# Patient Record
Sex: Female | Born: 1945 | Race: White | Hispanic: No | Marital: Married | Smoking: Never smoker
Health system: Southern US, Community
[De-identification: ages and names within clinical notes are randomized; demographics above are authoritative.]

## PROBLEM LIST (undated history)

## (undated) DIAGNOSIS — K5790 Diverticulosis of intestine, part unspecified, without perforation or abscess without bleeding: Secondary | ICD-10-CM

## (undated) DIAGNOSIS — I959 Hypotension, unspecified: Secondary | ICD-10-CM

## (undated) DIAGNOSIS — I10 Essential (primary) hypertension: Secondary | ICD-10-CM

## (undated) DIAGNOSIS — D1800 Hemangioma unspecified site: Secondary | ICD-10-CM

## (undated) DIAGNOSIS — K589 Irritable bowel syndrome without diarrhea: Secondary | ICD-10-CM

## (undated) DIAGNOSIS — K635 Polyp of colon: Secondary | ICD-10-CM

## (undated) DIAGNOSIS — G40909 Epilepsy, unspecified, not intractable, without status epilepticus: Secondary | ICD-10-CM

## (undated) DIAGNOSIS — I4891 Unspecified atrial fibrillation: Secondary | ICD-10-CM

## (undated) DIAGNOSIS — K5792 Diverticulitis of intestine, part unspecified, without perforation or abscess without bleeding: Secondary | ICD-10-CM

## (undated) HISTORY — DX: Hemangioma unspecified site: D18.00

## (undated) HISTORY — DX: Diverticulitis of intestine, part unspecified, without perforation or abscess without bleeding: K57.92

## (undated) HISTORY — DX: Essential (primary) hypertension: I10

## (undated) HISTORY — PX: SALIVARY GLAND SURGERY: SHX768

## (undated) HISTORY — DX: Unspecified atrial fibrillation: I48.91

## (undated) HISTORY — DX: Irritable bowel syndrome, unspecified: K58.9

## (undated) HISTORY — DX: Diverticulosis of intestine, part unspecified, without perforation or abscess without bleeding: K57.90

## (undated) HISTORY — PX: ABDOMINAL HYSTERECTOMY: SHX81

## (undated) HISTORY — PX: CERVICAL DISC SURGERY: SHX588

## (undated) HISTORY — DX: Epilepsy, unspecified, not intractable, without status epilepticus: G40.909

## (undated) HISTORY — DX: Polyp of colon: K63.5

## (undated) HISTORY — PX: OTHER SURGICAL HISTORY: SHX169

## (undated) HISTORY — PX: BREAST EXCISIONAL BIOPSY: SUR124

## (undated) HISTORY — PX: CHOLECYSTECTOMY: SHX55

## (undated) HISTORY — DX: Hypotension, unspecified: I95.9

---

## 1997-12-21 ENCOUNTER — Encounter: Admission: RE | Admit: 1997-12-21 | Discharge: 1998-03-21 | Payer: Self-pay

## 1998-10-12 ENCOUNTER — Other Ambulatory Visit: Admission: RE | Admit: 1998-10-12 | Discharge: 1998-10-12 | Payer: Self-pay | Admitting: Gastroenterology

## 1999-07-11 ENCOUNTER — Encounter: Admission: RE | Admit: 1999-07-11 | Discharge: 1999-07-11 | Payer: Self-pay | Admitting: Obstetrics and Gynecology

## 1999-07-11 ENCOUNTER — Encounter: Payer: Self-pay | Admitting: Obstetrics and Gynecology

## 1999-07-20 ENCOUNTER — Encounter: Admission: RE | Admit: 1999-07-20 | Discharge: 1999-07-20 | Payer: Self-pay | Admitting: Obstetrics and Gynecology

## 1999-07-20 ENCOUNTER — Encounter: Payer: Self-pay | Admitting: Obstetrics and Gynecology

## 2000-01-18 ENCOUNTER — Ambulatory Visit (HOSPITAL_COMMUNITY): Admission: RE | Admit: 2000-01-18 | Discharge: 2000-01-18 | Payer: Self-pay | Admitting: Gastroenterology

## 2000-01-18 ENCOUNTER — Encounter: Payer: Self-pay | Admitting: Gastroenterology

## 2000-07-15 ENCOUNTER — Encounter: Admission: RE | Admit: 2000-07-15 | Discharge: 2000-07-15 | Payer: Self-pay | Admitting: Obstetrics and Gynecology

## 2000-07-15 ENCOUNTER — Encounter: Payer: Self-pay | Admitting: Obstetrics and Gynecology

## 2001-08-12 ENCOUNTER — Encounter: Admission: RE | Admit: 2001-08-12 | Discharge: 2001-08-12 | Payer: Self-pay | Admitting: Family Medicine

## 2001-08-12 ENCOUNTER — Encounter: Payer: Self-pay | Admitting: Family Medicine

## 2001-09-09 ENCOUNTER — Encounter: Payer: Self-pay | Admitting: Family Medicine

## 2001-09-09 ENCOUNTER — Encounter: Admission: RE | Admit: 2001-09-09 | Discharge: 2001-09-09 | Payer: Self-pay | Admitting: Family Medicine

## 2001-09-24 ENCOUNTER — Ambulatory Visit (HOSPITAL_COMMUNITY): Admission: RE | Admit: 2001-09-24 | Discharge: 2001-09-24 | Payer: Self-pay | Admitting: Neurosurgery

## 2001-09-24 ENCOUNTER — Encounter: Payer: Self-pay | Admitting: Neurosurgery

## 2001-09-29 ENCOUNTER — Ambulatory Visit (HOSPITAL_COMMUNITY): Admission: RE | Admit: 2001-09-29 | Discharge: 2001-09-29 | Payer: Self-pay | Admitting: Neurosurgery

## 2001-09-29 ENCOUNTER — Encounter: Payer: Self-pay | Admitting: Neurosurgery

## 2001-09-30 ENCOUNTER — Ambulatory Visit (HOSPITAL_COMMUNITY): Admission: RE | Admit: 2001-09-30 | Discharge: 2001-09-30 | Payer: Self-pay | Admitting: Neurosurgery

## 2001-09-30 ENCOUNTER — Encounter: Payer: Self-pay | Admitting: Neurosurgery

## 2001-10-16 ENCOUNTER — Inpatient Hospital Stay (HOSPITAL_COMMUNITY): Admission: EM | Admit: 2001-10-16 | Discharge: 2001-10-22 | Payer: Self-pay | Admitting: Emergency Medicine

## 2002-03-22 ENCOUNTER — Encounter: Payer: Self-pay | Admitting: Neurosurgery

## 2002-03-22 ENCOUNTER — Encounter: Admission: RE | Admit: 2002-03-22 | Discharge: 2002-03-22 | Payer: Self-pay | Admitting: Neurosurgery

## 2002-06-01 ENCOUNTER — Other Ambulatory Visit: Admission: RE | Admit: 2002-06-01 | Discharge: 2002-06-01 | Payer: Self-pay | Admitting: Gynecology

## 2002-08-17 ENCOUNTER — Encounter: Admission: RE | Admit: 2002-08-17 | Discharge: 2002-08-17 | Payer: Self-pay | Admitting: Family Medicine

## 2002-08-17 ENCOUNTER — Encounter: Payer: Self-pay | Admitting: Family Medicine

## 2002-12-09 ENCOUNTER — Ambulatory Visit (HOSPITAL_COMMUNITY): Admission: RE | Admit: 2002-12-09 | Discharge: 2002-12-09 | Payer: Self-pay | Admitting: Internal Medicine

## 2002-12-27 ENCOUNTER — Encounter: Admission: RE | Admit: 2002-12-27 | Discharge: 2002-12-27 | Payer: Self-pay | Admitting: Family Medicine

## 2002-12-27 ENCOUNTER — Encounter: Payer: Self-pay | Admitting: Family Medicine

## 2003-03-11 ENCOUNTER — Ambulatory Visit (HOSPITAL_COMMUNITY): Admission: RE | Admit: 2003-03-11 | Discharge: 2003-03-11 | Payer: Self-pay | Admitting: Neurosurgery

## 2003-03-11 ENCOUNTER — Encounter: Payer: Self-pay | Admitting: Neurosurgery

## 2003-04-26 ENCOUNTER — Encounter: Admission: RE | Admit: 2003-04-26 | Discharge: 2003-04-26 | Payer: Self-pay | Admitting: Neurosurgery

## 2003-04-26 ENCOUNTER — Encounter: Payer: Self-pay | Admitting: Radiology

## 2003-04-26 ENCOUNTER — Encounter: Payer: Self-pay | Admitting: Neurosurgery

## 2003-05-13 ENCOUNTER — Encounter: Payer: Self-pay | Admitting: Neurosurgery

## 2003-05-13 ENCOUNTER — Encounter: Admission: RE | Admit: 2003-05-13 | Discharge: 2003-05-13 | Payer: Self-pay | Admitting: Neurosurgery

## 2003-05-27 ENCOUNTER — Encounter: Payer: Self-pay | Admitting: Neurosurgery

## 2003-05-27 ENCOUNTER — Encounter: Admission: RE | Admit: 2003-05-27 | Discharge: 2003-05-27 | Payer: Self-pay | Admitting: Neurosurgery

## 2003-08-26 ENCOUNTER — Encounter: Admission: RE | Admit: 2003-08-26 | Discharge: 2003-08-26 | Payer: Self-pay | Admitting: Family Medicine

## 2003-09-19 ENCOUNTER — Ambulatory Visit (HOSPITAL_COMMUNITY): Admission: RE | Admit: 2003-09-19 | Discharge: 2003-09-19 | Payer: Self-pay | Admitting: Gastroenterology

## 2003-11-24 ENCOUNTER — Ambulatory Visit (HOSPITAL_COMMUNITY): Admission: RE | Admit: 2003-11-24 | Discharge: 2003-11-24 | Payer: Self-pay | Admitting: Neurosurgery

## 2004-01-08 ENCOUNTER — Emergency Department (HOSPITAL_COMMUNITY): Admission: EM | Admit: 2004-01-08 | Discharge: 2004-01-08 | Payer: Self-pay | Admitting: Emergency Medicine

## 2004-07-18 ENCOUNTER — Ambulatory Visit (HOSPITAL_COMMUNITY): Admission: RE | Admit: 2004-07-18 | Discharge: 2004-07-18 | Payer: Self-pay | Admitting: Urology

## 2004-08-16 ENCOUNTER — Encounter (INDEPENDENT_AMBULATORY_CARE_PROVIDER_SITE_OTHER): Payer: Self-pay | Admitting: Specialist

## 2004-08-16 ENCOUNTER — Ambulatory Visit (HOSPITAL_COMMUNITY): Admission: RE | Admit: 2004-08-16 | Discharge: 2004-08-17 | Payer: Self-pay | Admitting: Surgery

## 2004-10-16 ENCOUNTER — Encounter: Admission: RE | Admit: 2004-10-16 | Discharge: 2004-10-16 | Payer: Self-pay | Admitting: Family Medicine

## 2005-01-02 ENCOUNTER — Ambulatory Visit: Payer: Self-pay | Admitting: Gastroenterology

## 2005-03-20 ENCOUNTER — Ambulatory Visit: Payer: Self-pay | Admitting: Gastroenterology

## 2005-06-08 ENCOUNTER — Ambulatory Visit (HOSPITAL_COMMUNITY): Admission: RE | Admit: 2005-06-08 | Discharge: 2005-06-08 | Payer: Self-pay | Admitting: Neurosurgery

## 2005-10-25 ENCOUNTER — Encounter: Admission: RE | Admit: 2005-10-25 | Discharge: 2005-10-25 | Payer: Self-pay | Admitting: Family Medicine

## 2006-01-09 ENCOUNTER — Ambulatory Visit (HOSPITAL_COMMUNITY): Admission: RE | Admit: 2006-01-09 | Discharge: 2006-01-09 | Payer: Self-pay | Admitting: Neurosurgery

## 2006-02-13 ENCOUNTER — Ambulatory Visit (HOSPITAL_COMMUNITY): Admission: RE | Admit: 2006-02-13 | Discharge: 2006-02-13 | Payer: Self-pay | Admitting: Neurosurgery

## 2006-06-27 ENCOUNTER — Ambulatory Visit: Payer: Self-pay | Admitting: Gastroenterology

## 2006-07-02 ENCOUNTER — Ambulatory Visit: Payer: Self-pay | Admitting: Cardiovascular Disease

## 2006-10-28 ENCOUNTER — Encounter: Admission: RE | Admit: 2006-10-28 | Discharge: 2006-10-28 | Payer: Self-pay | Admitting: Family Medicine

## 2006-11-06 ENCOUNTER — Ambulatory Visit: Payer: Self-pay | Admitting: Gastroenterology

## 2006-11-27 ENCOUNTER — Ambulatory Visit: Payer: Self-pay | Admitting: Gastroenterology

## 2007-01-13 ENCOUNTER — Ambulatory Visit (HOSPITAL_COMMUNITY): Admission: RE | Admit: 2007-01-13 | Discharge: 2007-01-13 | Payer: Self-pay | Admitting: Neurosurgery

## 2007-04-14 ENCOUNTER — Emergency Department (HOSPITAL_COMMUNITY): Admission: EM | Admit: 2007-04-14 | Discharge: 2007-04-15 | Payer: Self-pay | Admitting: Emergency Medicine

## 2007-04-27 ENCOUNTER — Ambulatory Visit: Payer: Self-pay | Admitting: Internal Medicine

## 2007-05-06 ENCOUNTER — Ambulatory Visit: Payer: Self-pay

## 2007-06-10 ENCOUNTER — Ambulatory Visit: Payer: Self-pay | Admitting: Internal Medicine

## 2007-10-07 ENCOUNTER — Other Ambulatory Visit: Admission: RE | Admit: 2007-10-07 | Discharge: 2007-10-07 | Payer: Self-pay | Admitting: Family Medicine

## 2007-10-14 ENCOUNTER — Encounter: Admission: RE | Admit: 2007-10-14 | Discharge: 2007-10-14 | Payer: Self-pay | Admitting: Family Medicine

## 2007-10-21 ENCOUNTER — Ambulatory Visit: Payer: Self-pay | Admitting: Internal Medicine

## 2007-12-18 ENCOUNTER — Emergency Department (HOSPITAL_COMMUNITY): Admission: EM | Admit: 2007-12-18 | Discharge: 2007-12-19 | Payer: Self-pay | Admitting: Emergency Medicine

## 2008-01-26 ENCOUNTER — Encounter: Admission: RE | Admit: 2008-01-26 | Discharge: 2008-01-26 | Payer: Self-pay | Admitting: Neurosurgery

## 2008-02-15 ENCOUNTER — Ambulatory Visit: Payer: Self-pay | Admitting: Internal Medicine

## 2008-02-18 ENCOUNTER — Encounter: Admission: RE | Admit: 2008-02-18 | Discharge: 2008-02-18 | Payer: Self-pay | Admitting: Otolaryngology

## 2008-03-10 ENCOUNTER — Ambulatory Visit: Payer: Self-pay

## 2008-03-10 ENCOUNTER — Ambulatory Visit: Payer: Self-pay | Admitting: Cardiology

## 2008-04-01 ENCOUNTER — Ambulatory Visit: Payer: Self-pay | Admitting: Internal Medicine

## 2008-08-03 ENCOUNTER — Ambulatory Visit: Payer: Self-pay | Admitting: Internal Medicine

## 2008-11-01 ENCOUNTER — Encounter: Admission: RE | Admit: 2008-11-01 | Discharge: 2008-11-01 | Payer: Self-pay | Admitting: Family Medicine

## 2008-11-22 ENCOUNTER — Encounter: Admission: RE | Admit: 2008-11-22 | Discharge: 2008-11-22 | Payer: Self-pay | Admitting: Family Medicine

## 2009-02-13 DIAGNOSIS — I4891 Unspecified atrial fibrillation: Secondary | ICD-10-CM | POA: Insufficient documentation

## 2009-02-13 DIAGNOSIS — I10 Essential (primary) hypertension: Secondary | ICD-10-CM | POA: Insufficient documentation

## 2009-02-13 DIAGNOSIS — D1800 Hemangioma unspecified site: Secondary | ICD-10-CM | POA: Insufficient documentation

## 2009-02-15 ENCOUNTER — Ambulatory Visit: Payer: Self-pay | Admitting: Internal Medicine

## 2009-03-29 ENCOUNTER — Telehealth: Payer: Self-pay | Admitting: Internal Medicine

## 2009-10-03 ENCOUNTER — Encounter: Admission: RE | Admit: 2009-10-03 | Discharge: 2009-10-03 | Payer: Self-pay | Admitting: Neurosurgery

## 2009-11-09 ENCOUNTER — Encounter: Admission: RE | Admit: 2009-11-09 | Discharge: 2009-11-09 | Payer: Self-pay | Admitting: Family Medicine

## 2010-04-17 ENCOUNTER — Telehealth: Payer: Self-pay | Admitting: Internal Medicine

## 2010-05-17 ENCOUNTER — Ambulatory Visit: Payer: Self-pay | Admitting: Internal Medicine

## 2010-05-17 ENCOUNTER — Encounter: Payer: Self-pay | Admitting: Internal Medicine

## 2010-08-26 ENCOUNTER — Encounter: Payer: Self-pay | Admitting: Neurosurgery

## 2010-09-06 NOTE — Progress Notes (Signed)
Summary: refill  Phone Note Refill Request Message from:  Patient on April 17, 2010 8:33 AM  Refills Requested: Medication #1:  FLECAINIDE ACETATE 50 MG TABS Take one tablet by mouth every 12 hours  Medication #2:  TOPROL XL 25 MG XR24H-TAB Take 1 tablet by mouth once a day [facility] [phone]  Initial call taken by: Judie Grieve,  April 17, 2010 8:34 AM    Prescriptions: TOPROL XL 25 MG XR24H-TAB (METOPROLOL SUCCINATE) Take 1 tablet by mouth once a day  #30 x 12   Entered by:   Kem Parkinson   Authorized by:   Laren Boom, MD, [facility]   Signed by:   Kem Parkinson on 04/18/2010   Method used:   Electronically to        [facility] [address] [phone]* (retail)       [address], [place]  [address]       Ph: [phone]       Fax: [phone]   RxID:   [id number] FLECAINIDE ACETATE 50 MG TABS (FLECAINIDE ACETATE) Take one tablet by mouth every 12 hours  #60 x 12   Entered by:   Kem Parkinson   Authorized by:   Laren Boom, MD, [facility]   Signed by:   Kem Parkinson on 04/18/2010   Method used:   Electronically to        [facility] [address] [phone]* (retail)       [address] [place]  [address]       Ph: [phone]       Fax: [phone]   RxID:   [id number]

## 2010-09-06 NOTE — Progress Notes (Signed)
  Phone Note Refill Request Call back at [phone] Message from:  Pharmacy/Alex on March 29, 2009 10:02 AM  Refills Requested: Medication #1:  FLECAINIDE ACETATE 50 MG TABS Take one tablet by mouth every 12 hours   Supply Requested: 3 months   Notes: 90 days supply  Medication #2:  TOPROL XL 25 MG XR24H-TAB Take 1 tablet by mouth once a day   Supply Requested: 3 months   Notes: 90 days suppy [facility] [place]   Method Requested: Fax to Local Pharmacy Initial call taken by: Migdalia Dk,  March 29, 2009 10:03 AM    Prescriptions: TOPROL XL 25 MG XR24H-TAB (METOPROLOL SUCCINATE) Take 1 tablet by mouth once a day  #90.0 Each x 2   Entered by:   Flonnie Overman   Authorized by:   Laren Boom, MD, [facility]   Signed by:   Flonnie Overman on 03/31/2009   Method used:   Electronically to        [facility]  [phone]* (retail)       [address]       [place], [place]  [address]       Ph: [phone] or [phone]       Fax: [phone]   RxID:   [id number] FLECAINIDE ACETATE 50 MG TABS (FLECAINIDE ACETATE) Take one tablet by mouth every 12 hours  #180.0 Each x 2   Entered by:   Flonnie Overman   Authorized by:   Laren Boom, MD, [facility]   Signed by:   Flonnie Overman on 03/31/2009   Method used:   Electronically to        [facility]  [phone]* (retail)       [address]       [place], [place]  [address]       Ph: [phone] or [phone]       Fax: [phone]   RxID:   [id number]

## 2010-09-06 NOTE — Assessment & Plan Note (Signed)
Summary: f1y   Visit Type:  Follow-up   History of Present Illness: Gwendolyn Alvarado returns today for followup of atrial fibrillation.  She is a 65 yo woman with a h/o atrial fibrillation which has been controlled very nicely with a combination of low dose flecainide and metoprolol. Since I saw her 12 months ago, she has had little if any atrial fibrillation.  She has tolerated her flecainide and beta blockers with out side effects. She denies peripheral edema.  Current Medications (verified): 1)  Aspirin Ec 325 Mg Tbec (Aspirin) .... Take One Tablet By Mouth Daily 2)  Flecainide Acetate 50 Mg Tabs (Flecainide Acetate) .... Take One Tablet By Mouth Every 12 Hours 3)  Toprol Xl 25 Mg Xr24h-Tab (Metoprolol Succinate) .... Take 1 Tablet By Mouth Once A Day 4)  Vimpat 50 Mg Tabs (Lacosamide) .... 3 Tablets Two Times A Day  Allergies (verified): 1)  ! Penicillin 2)  ! Vicodin 3)  ! Tegretol 4)  ! 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  History of benign breast biopsy on the right in the past.  History of submandibular gland surgery. History of Bartholins gland cyst resection in the past.  Vital Signs:  Patient profile:   65 year old female Height:      66.5 inches Weight:      146 pounds BMI:     23.30 Pulse rate:   69 / minute BP sitting:   120 / 82  (left arm)  Vitals Entered By: Laurance Flatten CMA (May 17, 2010 4:15 PM)  Physical Exam  General:  Well developed, well nourished, in no acute distress. Head:  normocephalic and atraumatic Eyes:  PERRLA/EOM intact; conjunctiva and lids normal. Mouth:  Teeth, gums and palate normal. Oral mucosa normal. Neck:  Neck supple, no JVD. No masses, thyromegaly or abnormal cervical nodes. Lungs:  Clear bilaterally to auscultation  with no wheezes or rhonchi. Heart:  RRR with normal S1 and S2. Abdomen:  Bowel sounds positive; abdomen soft and non-tender without masses, organomegaly, or hernias noted. No hepatosplenomegaly. Msk:  Back normal, normal gait. Muscle strength and tone normal. Pulses:  pulses normal in all 4 extremities Extremities:  No clubbing or cyanosis. No peripheral edema. Neurologic:  Alert and oriented x 3.   EKG  Procedure date:  05/17/2010  Findings:      Normal sinus rhythm with rate of: 65.   Impression & Recommendations:  Problem # 1:  PAROXYSMAL ATRIAL FIBRILLATION (ICD-427.31) She is maintaining NSR on low dose flecainide. Continue meds as below. Her updated medication list for this problem includes:    Aspirin Ec 325 Mg Tbec (Aspirin) .Marland Kitchen... Take one tablet by mouth daily    Flecainide Acetate 50 Mg Tabs (Flecainide acetate) .Marland Kitchen... Take one tablet by mouth every 12 hours    Toprol Xl 25 Mg Xr24h-tab (Metoprolol succinate) .Marland Kitchen... Take 1 tablet by mouth once a day  Orders: EKG w/ Interpretation (93000)  Problem # 2:  HYPERTENSION (ICD-401.9) Her blood pressure is well controlled.  She will continue her current meds. Her updated medication list for this problem includes:    Aspirin Ec 325 Mg Tbec (Aspirin) .Marland Kitchen... Take one tablet by mouth daily    Toprol Xl 25 Mg Xr24h-tab (Metoprolol succinate) .Marland Kitchen... Take 1 tablet by mouth once a day  Patient Instructions: 1)  Your physician wants  you to follow-up in:  12 months with Dr Ladona Ridgel. You will receive a reminder letter in the mail two months in advance. If you don't receive a letter, please call our office to schedule the follow-up appointment.

## 2010-09-06 NOTE — Assessment & Plan Note (Signed)
Summary: 6 month/dmp  Medications Added TOPROL XL 25 MG XR24H-TAB (METOPROLOL SUCCINATE) Take 1 tablet by mouth once a day CENTRUM SILVER ULTRA WOMENS  TABS (MULTIPLE VITAMINS-MINERALS) 1 by mouth once daily      Allergies Added: ! PENICILLIN ! VICODIN ! TEGRETOL ! DILANTIN  CC:  none.  History of Present Illness: Ms. Gwendolyn Alvarado returns today for followup of atrial fibrillation.  She is a 65 yo woman with a h/o atrial fibrillation which has been controlled very nicely with a combination of low dose flecainide and metoprolol. Since I saw her 6 months ago, she has had little if any atrial fibrillation.  She has tolerated her flecainide and beta blockers with out side effects. She denies peripheral edema.  Current Medications (verified): 1)  Depakote Er 500 Mg Xr24h-Tab (Divalproex Sodium) .Marland Kitchen.. 1 By Mouth Once Daily 2)  Aspirin Ec 325 Mg Tbec (Aspirin) .... Take One Tablet By Mouth Daily 3)  Flecainide Acetate 50 Mg Tabs (Flecainide Acetate) .... Take One Tablet By Mouth Every 12 Hours 4)  Kapidex 60 Mg Cpdr (Dexlansoprazole) .Marland Kitchen.. 1 By Mouth Two Times A Day 5)  Florastor 250 Mg Pack (Saccharomyces Boulardii) .Marland Kitchen.. 1 By Mouth Once Daily 6)  Vitamin E 600 Unit  Caps (Vitamin E) .Marland Kitchen.. 1 By Mouth Once Daily 7)  Toprol Xl 25 Mg Xr24h-Tab (Metoprolol Succinate) .... Take 1 Tablet By Mouth Once A Day 8)  Centrum Silver Ultra Womens  Tabs (Multiple Vitamins-Minerals) .Marland Kitchen.. 1 By Mouth Once Daily  Allergies (verified): 1)  ! Penicillin 2)  ! Vicodin 3)  ! Tegretol 4)  ! 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  History of benign breast biopsy on the right in the past.  History of submandibular gland surgery. History of Bartholins gland cyst resection in the past.  Review of Systems  The  patient denies chest pain, syncope, dyspnea on exertion, and peripheral edema.    Physical Exam  General:  Well developed, well nourished, in no acute distress. Head:  normocephalic and atraumatic Eyes:  PERRLA/EOM intact; conjunctiva and lids normal. Mouth:  Teeth, gums and palate normal. Oral mucosa normal. Neck:  Neck supple, no JVD. No masses, thyromegaly or abnormal cervical nodes. Lungs:  Clear bilaterally to auscultation and percussion. Heart:  Non-displaced PMI, chest non-tender; regular rate and rhythm, S1, S2 without murmurs, rubs or gallops. Carotid upstroke normal, no bruit. Normal abdominal aortic size, no bruits. Femorals normal pulses, no bruits. Pedals normal pulses. No edema, no varicosities. Abdomen:  Bowel sounds positive; abdomen soft and non-tender without masses, organomegaly, or hernias noted. No hepatosplenomegaly. Msk:  Back normal, normal gait. Muscle strength and tone normal. Pulses:  pulses normal in all 4 extremities Extremities:  No clubbing or cyanosis. No peripheral edema. Neurologic:  Alert and oriented x 3.   Vital Signs:  Patient profile:   65 year old female Height:      66.5 inches Weight:      147 pounds BMI:     23.46 Pulse rate:   69 / minute Pulse rhythm:   regular BP sitting:   112 / 70  (left arm) Cuff size:   regular  Vitals Entered By: Flonnie Overman (February 15, 2009 9:44 AM)  EKG  Procedure date:  02/15/2009  Findings:      Normal sinus rhythm with rate of:  65. Non-specific  T wave abnormality.  Impression & Recommendations:  Problem # 1:  PAROXYSMAL ATRIAL FIBRILLATION (ICD-427.31) She has been basically asymptomatic on low dose flecainide and beta blockers.  Will continue these meds and followup in 12 months.  She is not a coumadin candidate secondary to her prior hemangioma. Her updated medication list for this problem includes:    Aspirin Ec 325 Mg Tbec (Aspirin) .Marland Kitchen... Take one tablet by mouth daily    Flecainide Acetate 50 Mg  Tabs (Flecainide acetate) .Marland Kitchen... Take one tablet by mouth every 12 hours    Toprol Xl 25 Mg Xr24h-tab (Metoprolol succinate) .Marland Kitchen... Take 1 tablet by mouth once a day  Orders: EKG w/ Interpretation (93[address])  Problem # 2:  HYPERTENSION (ICD-401.9) Her blood pressure remains well controlled.  Continue toprol. Her updated medication list for this problem includes:    Aspirin Ec 325 Mg Tbec (Aspirin) .Marland Kitchen... Take one tablet by mouth daily    Toprol Xl 25 Mg Xr24h-tab (Metoprolol succinate) .Marland Kitchen... Take 1 tablet by mouth once a day  Patient Instructions: 1)  Your physician recommends that you schedule a follow-up appointment in: 12 months Prescriptions: TOPROL XL 25 MG XR24H-TAB (METOPROLOL SUCCINATE) Take 1 tablet by mouth once a day  #30 x 11   Entered by:   Duncan Dull, RN, BSN   Authorized by:   Laren Boom, MD, [facility]   Signed by:   Duncan Dull, RN, BSN on 02/15/2009   Method used:   Electronically to        [facility]  [phone]* (retail)       [address]       [place], [place]  [address]       Ph: [phone] or [phone]       Fax: [phone]   RxID:   [id number]

## 2010-09-18 ENCOUNTER — Other Ambulatory Visit: Payer: Self-pay | Admitting: Family Medicine

## 2010-09-18 DIAGNOSIS — N6321 Unspecified lump in the left breast, upper outer quadrant: Secondary | ICD-10-CM

## 2010-09-18 DIAGNOSIS — N644 Mastodynia: Secondary | ICD-10-CM

## 2010-09-24 ENCOUNTER — Other Ambulatory Visit: Payer: Self-pay

## 2010-09-25 ENCOUNTER — Ambulatory Visit
Admission: RE | Admit: 2010-09-25 | Discharge: 2010-09-25 | Disposition: A | Discharge status: Home / Self Care | Payer: BC Managed Care – PPO | Source: Ambulatory Visit | Attending: Family Medicine | Admitting: Family Medicine

## 2010-09-25 ENCOUNTER — Ambulatory Visit
Admission: RE | Admit: 2010-09-25 | Discharge: 2010-09-25 | Disposition: A | Discharge status: Home / Self Care | Payer: Medicare Other | Source: Ambulatory Visit | Attending: Family Medicine | Admitting: Family Medicine

## 2010-09-25 DIAGNOSIS — N6321 Unspecified lump in the left breast, upper outer quadrant: Secondary | ICD-10-CM

## 2010-09-25 DIAGNOSIS — N644 Mastodynia: Secondary | ICD-10-CM

## 2010-12-18 NOTE — Assessment & Plan Note (Signed)
[facility]                         ELECTROPHYSIOLOGY OFFICE NOTE   NAME:Alvarado Alvarado TOTARO                          MRN:          [id number]  DATE:04/01/2008                            DOB:          1945-11-03    Alvarado Alvarado returns today for followup.  She is very pleasant 65 year old  woman with a history of paroxysmal Afib.  She has history of  hypertension.  She has cavernous hemangioma.  The patient was initially  referred back to me in March of this year and at that time, we talked  about the different treatment options and recommended combination of  beta-blocker and low-dose flecainide to control her symptoms.  She has  done well with this.  If any palpitations, she denies chest pain or  shortness of breath.  She underwent stress testing, which demonstrated  no evidence of ischemia, and no pararrhythmia on her flecainide.   MEDICATIONS:  1. Depakote ER 500 twice a day.  2. Zegerid 40/100 twice daily.  3. Aspirin 325 a day.  4. Symax 0.375 p.r.n.  5. Metoprolol succinate 25 mg daily.  6. Flecainide 50 twice a day.   PHYSICAL EXAMINATION:  GENERAL:  She is pleasant well-appearing 62-year-  old woman in no distress.  VITAL SIGNS:  Blood pressure was 120/72, pulse 68 and regular,  respirations were 18, weight was 144 pounds.  NECK:  No jugular distention.  LUNGS:  Clear bilaterally to auscultation.  No wheezes, rales, or  rhonchi are present.  CARDIOVASCULAR:  Regular rate and rhythm.  Normal S1 and S2.  EXTREMITIES:  No edema.   EKG demonstrates sinus rhythm with normal axis and intervals.  QRS  duration was 110 milliseconds.   IMPRESSION:  1. Paroxysmal atrial fibrillation.  2. Borderline hypertension.  3. History of cavernous hemangioma.   DISCUSSION:  Alvarado Alvarado's AFib is quiet and stable.  She is tolerating  flecainide twice daily very nicely.  I will plan to see the patient back  in the office for followup of her AFib in 6 months.  She  is instructed to call the office for worsening AFib, in which case we  would consider increasing her flecainide to 50 three times a day or to  total of 150 mg in divided doses.     Doylene Canning. Ladona Ridgel, MD  Electronically Signed    GWT/MedQ  DD: 04/01/2008  DT: 04/02/2008  Job #: [id number]

## 2010-12-18 NOTE — Letter (Signed)
April 27, 2007    Payton Doughty, M.D.  [address]  [address]  [place], [place] [address]   RE:  BRIANAH, HOPSON  MRN:  [id number]  /  DOB:  1946-06-01   Dear Loraine Leriche,   Today I saw Carma Lair as a patient of yours, who has a history of  cavernous hemangioma.  She was in the emergency department several weeks  ago with atrial fibrillation and a rapid ventricular response.  The  patient subsequently reverted back to sinus rhythm and has been  discharged on aspirin therapy along with beta blockers and has had no  more symptoms of A fib.  Otherwise, she is stable.   Her exam was fairly unremarkable today, as was her EKG.   I have discussed the treatment options with the patient.  For now, there  is no indication for Coumadin therapy, but I think full-strength aspirin  would be warranted for thromboembolic prevention.  She states that she  has spoken to you, or her daughter, who is a physician in [place], has  spoken to you, and the aspirin therapy should be safe with her  neurologic condition.  I plan on discussing this with you further but  would like to go ahead and have her continue on aspirin.  We will obtain  a 2D echo.  Will continue her beta blocker.  Will plan to see her back  in the office in several months  She is instructed to call us if she  develops A fib.   Once again, thanks for referring Ms. Awtrey for EP evaluation.  I will  keep you apprised if any new developments, specifically whether she by  chance might ultimately need to be on Coumadin, should develop.    Sincerely,      Doylene Canning. Ladona Ridgel, MD  Electronically Signed    GWT/MedQ  DD: 04/27/2007  DT: 04/28/2007  Job #: [id number]

## 2010-12-18 NOTE — Assessment & Plan Note (Signed)
[facility]                         ELECTROPHYSIOLOGY OFFICE NOTE   NAME:Gwendolyn Alvarado, Gwendolyn Alvarado                          MRN:          [id number]  DATE:08/03/2008                            DOB:          Mar 19, 1946    Gwendolyn Alvarado returns today for followup.  She is a very pleasant middle-aged  woman with a history of symptomatic atrial fibrillation who I saw  initially several months ago.  She was placed at that time on a  combination of flecainide and beta-blockers and she returns today for  followup.  Since I saw her last several months ago, she had done quite  well.  She has no symptomatic palpitations.  She has had no documented  AFib.  She is stable.  She denies any problems with her blood pressure.  She notes that she is about to go on a cruise.  She wants to know  whether alcohol intake would be acceptable and whether she could have  chocolate to eat.   CURRENT MEDICATIONS:  1. Depakote ER 500 twice a day.  2. Aspirin 325 a day.  3. Symax p.r.n.  4. Flecainide 50 twice a day.  5. Kapidex 60 b.i.d.  6. Florastor 250 mg a day.  7. Vitamin E.   PHYSICAL EXAMINATION:  GENERAL:  She is a pleasant well-appearing middle-  aged woman, in no distress.  VITAL SIGNS:  Blood pressure was 110/70, the pulse was 80 and regular,  respirations were 18.  Weight was 144 pounds.  NECK:  No jugular venous distention.  There is no thyromegaly.  LUNGS:  Clear bilaterally to auscultation.  No wheezes, rales, or  rhonchi are present.  CARDIOVASCULAR:  Regular rate and rhythm.  Normal S1 and S2.  There was  occasional skip beat noted.  There were no murmurs, rubs, or gallops.  ABDOMEN:  Soft, nontender.  There is no organomegaly.  The bowel sounds  are present.  There is no rebound or guarding.  EXTREMITIES:  Demonstrate no cyanosis, clubbing, or edema.  The pulses are 2+ and  symmetric.  NEUROLOGIC:  Alert and oriented x3.  Cranial nerves intact.   EKG demonstrates sinus  rhythm with frequent PVCs.   IMPRESSION:  1. Symptomatic and paroxysmal atrial fibrillation.  2. Flecainide therapy secondary to symptomatic and paroxysmal atrial      fibrillation.  3. Borderline hypertension.  4. History of cavernous angioma, status post evaluation.   DISCUSSION:  Overall, Gwendolyn Alvarado is stable.  She is maintaining in sinus  rhythm very nicely on flecainide therapy.  I will plan to see the  patient back in the office for followup in 6 months.     Doylene Canning. Ladona Ridgel, MD  Electronically Signed    GWT/MedQ  DD: 08/03/2008  DT: 08/04/2008  Job #: [id number]

## 2010-12-18 NOTE — Assessment & Plan Note (Signed)
[facility]                         ELECTROPHYSIOLOGY OFFICE NOTE   NAME:Snuffer, MARCELLA CHARLSON                          MRN:          [id number]  DATE:06/10/2007                            DOB:          21-Aug-1945    Ms. Margulies returns today for follow-up.  She is a very pleasant middle-  aged woman with paroxysmal atrial fibrillation and a history of a  cavernous hemangioma, who returns today for follow-up.  When I saw her  back in the office in September I recommended that we undergo a 2-D  echo, which demonstrated preserved LV systolic function.  She also had a  mildly enlarged left atrium.  Otherwise, she was stable.  Since her  clinic visit in September she has done well with very minimal, if any,  palpitations or recurrent episodes of atrial fibrillation.   MEDICATIONS:  1. Depakote ER 500 mg twice daily.  2. Zegerid.  3. Aspirin 325 mg a day.  4. Lyrica 50 mg a day.  5. Metoprolol 25 mg a day.  6. Fosamax.  7. Caltrate.   On exam, she is a pleasant, well-appearing middle-aged woman in no acute  distress.  The blood pressure was 111/70, the pulse was 68 and regular,  respirations were 18.  The weight was 143 pounds.  NECK:  No jugular venous distention.  LUNGS:  Clear bilaterally to auscultation.  No wheezes, rales or rhonchi  were present.  CARDIOVASCULAR:  Regular rate and rhythm with normal S1 and S2.  EXTREMITIES:  No edema.   IMPRESSION:  1. Paroxysmal atrial fibrillation.  2. History of cavernous hemangioma.   DISCUSSION:  Overall, Ms. Gural is stable.  Her atrial fibrillation is  well-controlled.  I have asked that she come back to see [place] in a year,  sooner should she have worsening symptoms.  She will be maintained on  aspirin therapy.     Doylene Canning. Ladona Ridgel, MD  Electronically Signed    GWT/MedQ  DD: 06/10/2007  DT: 06/11/2007  Job #: [id number]   cc:   Payton Doughty, M.D.

## 2010-12-18 NOTE — Assessment & Plan Note (Signed)
[facility]                         ELECTROPHYSIOLOGY OFFICE NOTE   NAME:Byrom, LEIDA LUTON                          MRN:          [id number]  DATE:02/15/2008                            DOB:          [birth year]    Ms. Easterwood returns today for followup.  She is a very pleasant 65 year old  woman with a history of cavernous hemangioma and paroxysmal AFib who I  saw back in March 2009.  At that time, we discussed the treatment  options for AFib which was slightly increased in frequency and severity  and it continues to be this way.  At that time, we decided to continue  her on beta-blockers with both long-acting given daily and short-acting  given p.r.n.  She is also on aspirin therapy in conjunction with her  Depakote for seizure control and Zegerid.  She returns today for  followup.  She notes that in the interim, her AFib has increased in  frequency lasting up to a couple of hours at a time.  She typically has  an episode every 2-3 weeks.  She notes that the worry of having this is  making her quality of life go down.   PHYSICAL EXAMINATION:  GENERAL:  Pleasant middle-aged woman who is in no  acute distress.  VITAL SIGNS:  Blood pressure was 130/78, the pulse was 76 and regular,  respirations were 18, and the weight was 144 pounds.  NECK:  No jugular venous distention.  LUNGS:  Clear bilaterally to auscultation.  No wheezes, rales, or  rhonchi are present.  CARDIOVASCULAR:  Regular rate and rhythm with normal S1 and S2.  There  are no murmurs, rubs, or gallops appreciated.  ABDOMEN:  Soft and nontender.  EXTREMITIES:  No cyanosis, clubbing, or edema.  The pulses were 2+ and  symmetric.   The EKG demonstrates sinus rhythm with normal axis and intervals.  I  noted an EKG from Dec 18, 2007, demonstrating paroxysms of AFib.   IMPRESSION:  1. Paroxysmal atrial fibrillation.  2. History of cavernous hemangioma.   DISCUSSION:  Ms. Hanken is stable.  Her AFib  appears to be increasing in  frequency and severity though not significantly so.  Because of the  anxiousness associated with AFib, the patient would like to try to have  better control on this affect.  I have asked that she start 50 mg twice  daily of flecainide in conjunction with her low-dose beta-blocker.  We  will plan on having her come back in for a stress test approximately a  week or two after starting her flecainide.  I will see her back after  this as to see how she does.     Doylene Canning. Ladona Ridgel, MD  Electronically Signed    GWT/MedQ  DD: 02/15/2008  DT: 02/16/2008  Job #: [id number]

## 2010-12-18 NOTE — Assessment & Plan Note (Signed)
[facility]                         ELECTROPHYSIOLOGY OFFICE NOTE   NAME:Gwendolyn Alvarado, Gwendolyn Alvarado                          MRN:          [id number]  DATE:03/10/2008                            DOB:          August 30, 1945    ALLERGIES:  She has allergies to PENICILLIN, CODEINE, DILANTIN, and  TEGRETOL.   NEUROSURGEON:  Payton Doughty, M.D.   PRESENTING CIRCUMSTANCES:  The patient is presenting for a exercise  treadmill after 2-week course of flecainide, which began on February 23, 2008.   BRIEF HISTORY:  This is a 65 year old female who has a history of  paroxysmal atrial fibrillation.  She has episodes every 2-3 weeks.  They  seem to be increasing in frequency and severity and she saw Dr. Lewayne Bunting on February 15, 2008.  Prior to her office visit in July, the patient  was on metoprolol succinate 25 mg daily, and she would carry  metoprolol  tartrate tablets, 25 mg,  to be used at the onset of a paroxysmal atrial  fibrillation.  At the office visit, February 15, 2008, the patient was  started on flecainide 50 mg b.i.d., which the patient started on February 23, 2008.  She presents for exercise treadmill study today March 10, 2008.   The patient's resting electrocardiogram showed a sinus rhythm with  narrow QRS with no ectopy.  The patient was started on a Bruce protocol  treadmill walk.  Her target heart rate was 134 beats per minute.  She  walked for 6 minutes 3 seconds before stopping, because her legs got  tired.  She was only mildly short of breath and had no chest pain.  The  patient had no ectopy until her heart rate got above 100 and then she  would have isolated PVCs, 1 every 7-12 beats in the range of 100-120  beats per minute.  Interestingly enough, when her heart rate is 120-30  there was absolutely no ectopy.  This course repeated itself as the  patient went into the recovery mode in the 100 to 120 range.  Once  again, the patient had isolated PVCs.  These were  asymptomatic.  The  patient did have some questioning as to what those were and was slightly  anxious about this, but the electrophysiology tech reassured her that  these were totally benign.  The patient will continue flecainide 50 mg  b.i.d. and she will see Dr. Ladona Ridgel in the office in 2-3 weeks.  This  appointment will be made before she leaves.   MEDICATIONS:  1. Depakote 500 mg twice daily.  2. Zegerid 40/1100 twice daily.  3. Enteric-coated aspirin 325 mg daily.  4. Metoprolol succinate 25 mg daily.  5. Metoprolol tartrate 25 mg p.r.n. for paroxysmal atrial fibrillation      and now flecainide 50 mg twice daily.   PAST MEDICAL HISTORY:  History of cavernous hemangioma.  She is status  post cholecystectomy.  She has irritable bowel syndrome and GERD.   Of note, she had an echocardiogram, July 2008, her ejection fraction was  55-65%  with mild diastolic dysfunction, mild mitral valve prolapse.  No  left ventricular wall motion abnormalities.  I  believe that she also has a history of seizure disorder.  Once again,  the patient follows up with Dr. Ladona Ridgel in the office.      Maple Mirza, PA  Electronically Signed      Doylene Canning. Ladona Ridgel, MD  Electronically Signed   GM/MedQ  DD: 03/10/2008  DT: 03/10/2008  Job #: [id number]

## 2010-12-18 NOTE — Assessment & Plan Note (Signed)
[facility]                         ELECTROPHYSIOLOGY OFFICE NOTE   NAME:Alvarado, Gwendolyn VENTRESS                          MRN:          [id number]  DATE:10/21/2007                            DOB:          1945/11/12    Gwendolyn Alvarado returns today for followup.  She is a very pleasant, 65-year-  old woman with a history of cavernous hemangioma and paroxysmal atrial  fibrillation who has been maintained on beta-blockers and aspirin.  She  returns today for followup.  Her Gwendolyn Alvarado, who is a physician, is with  her today and she noted that she had some increase in the frequency,  although she does not feel these very much.  She noticed one episode  where she was doing some talking in front of an audience where she felt  palpitations, weak, fatigued and lightheaded, but this resolved.  She  had no specific complaints otherwise.   PHYSICAL EXAMINATION:  GENERAL:  She is a pleasant, 65 year old woman in  no acute distress.  VITAL SIGNS:  Blood pressure was 108/68, pulse 64 and regular,  respirations 18, weight was 144 pounds.  NECK:  No jugular venous distention.  LUNGS:  Clear bilaterally to auscultation.  No wheezes, rales or rhonchi  are present.  No increased work of breathing.  CARDIAC:  Regular rate and rhythm.  Normal S1 and S2.  ABDOMEN:  Soft, nontender.  EXTREMITIES:  Demonstrated no cyanosis, clubbing or edema.  Pulses 2+  symmetric.   MEDICATIONS:  1. Depakote ER 500 twice daily.  2. Zegerid twice daily.  3. Aspirin 325 a day.  4. Lyrica 50 a day.  5. Metoprolol 25 daily.  This was long-acting.   EKG demonstrates sinus rhythm with normal axis and intervals.   IMPRESSION:  1. Paroxysmal atrial fibrillation.  2. Cavernous hemangioma.  3. Aspirin therapy secondary to #1.   DISCUSSION:  Overall, Gwendolyn Alvarado is stable.  I have spent a considerable  amount of time today talking with her Gwendolyn Alvarado and the patient regarding  treatment algorithms for atrial  fibrillation with specific regard to  rate versus rhythm control and no indication for Coumadin therapy.  At  the present time, she has a CHADS score of 0 and is not indicated for  Coumadin.  I suspect her hemangioma probably precludes her from Coumadin  at least ideally so.  With regard to her atrial fibrillation, I have  asked that she continue her once a day, long-acting beta-blocker and I  have given her a prescription to call in for short-acting p.r.n.  Lopressor.  I will see her back in the office in several months.     Gwendolyn Canning. Gwendolyn Ridgel, MD  Electronically Signed    GWT/MedQ  DD: 10/21/2007  DT: 10/22/2007  Job #: [id number]   cc:   Payton Doughty, M.D.

## 2010-12-18 NOTE — Assessment & Plan Note (Signed)
HEALTHCARE                         ELECTROPHYSIOLOGY OFFICE NOTE   NAME:Sleeper, SHAWNESE MAGNER                          MRN:          [id number]  DATE:04/27/2007                            DOB:          [birth year]    Ms. Glazebrook is referred today for evaluation of atrial fibrillation by Dr.  Trey Sailors.  The patient is a very pleasant 65 year old woman who  presented to the [facility] emergency department several weeks ago with  atrial fibrillation and a rapid ventricular response.  At that time, she  felt palpitations and a sense of uneasiness and maybe some very mild  dyspnea.  There was no chest pain and no syncope.  She ultimately  returned back to sinus rhythm spontaneously.  She was begun on  metoprolol 25 mg daily in the long-acting form and an aspirin.  There  was a question about whether or not this was safe, as the patient's past  medical history, by her report, is notable for a cavernous hemangioma.  She otherwise has been stable and really denies much in the way of  medical problems.  She has never had extensive atrial fibrillation in  the past, and her health has otherwise been stable.   Her past surgical history is notable for a gallbladder removal and  hysterectomy in the past.  She had a Bartholin's sebaceous gland cyst  removed in the past and a lumpectomy, all in the 70s.  The patient has  had very subtle neurologic findings, ultimately leading to the diagnosis  of her hemangioma.   Present medications include Depakote, Zegerid, Lyrica, aspirin 325 a  day, metoprolol 25 a day, and Fosamax.  She is also on calcium  supplements.   Her family history is notable for a mother who died of complications of  carcinoid syndrome.  Her father died in an accident.   SOCIAL HISTORY:  Patient denies tobacco or ethanol use.  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   REVIEW OF SYSTEMS:   Notable for occasional problems with constipation  and fatigue.  She also has a history of irritable bowel syndrome,  having seen Dr. Corinda Gubler in the past.  She has a history of reflux  symptoms.  She has a history of anxiety, a history of depression.  Otherwise her review of systems was negative.   PHYSICAL EXAMINATION:  She is a pleasant, well-appearing 65 year old  woman in no acute distress.  Blood pressure was 102/76, pulse 73 and regular.  Respirations were 16.  The weight was 145 pounds.  HEENT:  Normocephalic and atraumatic.  Pupils are equal and round.  Oropharynx is moist.  Sclerae are anicteric.  NECK:  No jugular venous distention.  There is no thyromegaly.  Trachea  is midline.  The carotids are 2+ and symmetric.  LUNGS:  Clear bilaterally to auscultation.  No wheezes, rales or rhonchi  are present.  There is no increased work of breathing.  CARDIOVASCULAR:  Regular rate and rhythm with a normal S1 and S2.  There  are no murmurs, rubs or gallops present.  The PMI was not enlarged nor  was it laterally displaced.  ABDOMEN:  Soft, nontender, nondistended.  There is no organomegaly.  The  bowel sounds are present.  There is no rebound or guarding.  EXTREMITIES:  No clubbing, cyanosis or edema.  Pulses are 2+ and  symmetric.  NEUROLOGIC:  Alert and oriented x3.  Cranial nerves are intact.  Strength is 5/5 and symmetric.   EKG demonstrates a sinus rhythm with normal axis and intervals.   IMPRESSION:  1. New onset atrial fibrillation, now back to sinus rhythm with      minimal symptoms.  2. A history of cavernous hemangioma.   DISCUSSION:  The patient's age of 89, lack of hypertension, diabetes,  heart failure or other risks for stroke make her a candidate for aspirin  therapy without Coumadin.  My sense is that her neurologic condition  would allow this and will plan on proceeding with the use of full-  strength aspirin, although I will confirm this with her  neurosurgeon,  Dr. Trey Sailors.  With regards to her A fib, I think we should obtain a  screening 2D echo.  There is no evidence of any ischemia, so I do not  think stress test at the present time is warranted.  We will plan on  seeing the patient back in the office in several months, sooner if her  echo shows LV dysfunction or other unexpected findings.     Doylene Canning. Ladona Ridgel, MD  Electronically Signed    GWT/MedQ  DD: 04/27/2007  DT: 04/28/2007  Job #: [id number]   cc:   Payton Doughty, M.D.

## 2010-12-21 NOTE — Consult Note (Signed)
[place]  Patient:    Gwendolyn Alvarado, Gwendolyn Alvarado Visit Number: [id number] MRN: [id number]          Service Type: MED Location: 3000 3023 01 Attending Physician:  Emeterio Reeve Dictated by:   Marlan Palau, M.D. Proc. Date: 10/20/01 Admit Date:  10/16/2001   CC:         [facility], [address] Gwendolyn Alvarado, M.D.   Consultation Report  HISTORY OF PRESENT ILLNESS:  The patient is a 65 year old left-handed white female born 1946-04-04 with a history of sensory events thought secondary to seizures.  The patient began having episodes in December 2002 with spells of transient sensory alteration in the face and hand on the right side without twitching, loss of consciousness, or clouding of consciousness.  The patient underwent a thorough evaluation that included an MRI of the brain, MRI spectroscopy, CT imaging, and angiogram that finally determined that the patient had a cavernous angioma involving the left temporal region.  The patient has been treated with Dilantin but got a rash to this and was switched to Tegretol and developed another rash that was quite severe with some oral lesions and rash throughout the body.  The patient was taken off of Tegretol, was admitted, and was placed on IV steroids.  Neurology was asked to see this patient for further evaluation.  The patient has not had any sensory events recently.  PAST MEDICAL HISTORY: 1. Left temporal venous angioma. 2. Status post hysterectomy in 1999. 3. History of benign breast biopsy on the right in the past. 4. History of submandibular gland surgery. 5. History of Bartholins gland cyst resection in the past.  CURRENT MEDICATIONS: 1. Pepcid 20 mg twice a day. 2. Docusate 100 mg q.12h. 3. Decadron 10 mg q.8h. 4. Xanax 0.5 mg if needed. 5. Benadryl if needed.  HABITS:  The patient does not smoke or drink.  ALLERGIES: 1. PENICILLIN. 2. TEGRETOL. 3.  DILANTIN.  SOCIAL HISTORY:  The patient [place] in the [place] area. She is married and has three children; all alive and well.  FAMILY HISTORY:  Notable for mother died with stroke and lung cancer.  Father died following a motor vehicle accident.  The patient has one sister who is alive and well.  No family history of seizures noted.  REVIEW OF SYSTEMS:  Notable for some occasional fevers recently with the rash. The patient denies headache, has occasional blurred vision.  Denies shortness of breath, chest pain, weakness in the extremities, bowel or bladder control problems, loss of consciousness, staggering problems, dizziness.  PHYSICAL EXAMINATION:  VITAL SIGNS:  Blood pressure is 105/68, heart rate is 92 and regular, respirations 20, temperature afebrile.  GENERAL:  This patient is a well-developed white female who is alert and cooperative at the time of examination.  HEENT:  Head is atraumatic.  Eyes:  Pupils are equal, round, and reactive to light.  Disks are flat bilaterally.  NECK:  Supple.  No carotid bruits noted.  RESPIRATORY:  Clear.  CARDIOVASCULAR:  Regular rate and rhythm with no obvious murmurs or rubs noted.  EXTREMITIES:  Without significant edema.  A rash is noted in the arms and legs, thighs, upper chest.  NEUROLOGIC:  The patient has symmetric face with good pinprick sensation on the face to pinprick and soft touch.  Visual fields are full.  Speech is well enunciated.  The patient has good strength on the arms and legs.  Good sensation to pinprick, soft touch, and vibratory sensation throughout.  The patient has good finger-nose-finger and heel-to-shin.  The patient is not ambulated.  Pronator drift is negative.  Deep tendon reflexes are symmetric and normal.  Toes are downgoing bilaterally.  LABORATORY DATA:  Laboratory values notable for a sodium of 139, potassium 3.5, chloride of 104, CO2 29, glucose 97, BUN 14, creatinine 0.7,  calcium 9.3, albumin of 4.2, total protein 7.0, AST of 32, ALT of 77, alkaline phosphatase of 108, total bilirubin 0.4.  White count 13.3, hematocrit 43.3, hemoglobin 15.1, MCV 90.7, platelets 227.  IMPRESSION: 1. History of venous cavernous angioma, left temporal region. 2. 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 3. Allergic response to DILANTIN and TEGRETOL.  This patient has a history that could be consistent with simple sensory seizures.  Given the nature of the seizure events, medical treatment of the events is not completely medically necessary.  One option would be not to treat with medication whatsoever.  If the episodes become very frequent or are changing in any way, particularly if it includes clouding of consciousness, treatment would be indicated.  First of all, we need to document whether these sensory events are actually seizures.  PLAN: 1. Electroencephalogram study as an outpatient. 2. Consider a video electroencephalogram monitoring if the    electroencephalogram studies are normal.  The patient is not going on    anticonvulsant therapy at this point. Dictated by:   Marlan Palau, M.D. Attending Physician:  Emeterio Reeve DD:  10/20/01 TD:  10/21/01 Job: [id number]

## 2010-12-21 NOTE — H&P (Signed)
[place]  Patient:    Gwendolyn Alvarado, Gwendolyn Alvarado Visit Number: [id number] MRN: [id number]          Service Type: MED Location: 3000 3023 01 Attending Physician:  Emeterio Reeve Dictated by:   Payton Doughty, M.D. Admit Date:  10/16/2001                           History and Physical  ADMITTING DIAGNOSIS:  Dilantin reaction.  SERVICE:  Neurosurgery.  HISTORY OF PRESENT ILLNESS:  [age]-year-old right-handed white lady with a left mesiotemporal cavernous hemangioma.  She was started on Dilantin a few weeks ago, developed a rash, stopped that, put her on Tegretol, she developed a rash again and has been having a little bit of blistering, some in her lip. She was placed on p.o. Decadron several days ago with an improvement.  As the Decadron was tapered, she worsened again and came to the emergency room.  PAST MEDICAL HISTORY:  Medical history is otherwise benign.  ALLERGIES:  Apparently to DILANTIN.  PAST SURGICAL HISTORY:  None.  MEDICATIONS:  She is on no other medications.  SOCIAL HISTORY:  She does not smoke or drink.  PHYSICAL EXAMINATION:  GENERAL:  She is awake.  SKIN:  She has a skin rash that is facial and on chest and arms, not on the palms of her hands.  There is some small blistering on the inside of her mouth.  HEENT:  Within normal limits.  NECK:  She has good range of motion of her neck.  CHEST:  Clear.  CARDIAC:  Regular rate and rhythm.  ABDOMEN:  Nontender with no hepatosplenomegaly.  BACK:  She has a little posterior chest wall pain with deep respirations. There is no palpable tenderness.  EXTREMITIES:  Without clubbing or cyanosis but she does have the rash on her legs.  NEUROLOGIC:  She is awake, alert and oriented.  Her cranial nerves are intact. Motor exam shows 5/5 strength throughout the upper and lower extremities.  No pronator drift.  Toes are downgoing.  Reflexes are nonpathologic.  CLINICAL IMPRESSION:   A Dilantin reaction.  PLAN:  She needs to be admitted and placed on high-dose steroids.  We will check liver function tests, CBC and a sed rate. Dictated by:   Payton Doughty, M.D. Attending Physician:  Emeterio Reeve DD:  10/16/01 TD:  10/17/01 Job: [id number]

## 2010-12-21 NOTE — Op Note (Signed)
NAME:  Gwendolyn Alvarado, Gwendolyn Alvarado                   ACCOUNT NO.:  [id number]   MEDICAL RECORD NO.:  [id number]          PATIENT TYPE:  OIB   LOCATION:  2550                         FACILITY:  [facility]   PHYSICIAN:  Velora Heckler, MD      DATE OF BIRTH:  02/03/1946   DATE OF PROCEDURE:  08/16/2004  DATE OF DISCHARGE:                                 OPERATIVE REPORT   PREOPERATIVE DIAGNOSIS:  Symptomatic cholelithiasis.   POSTOPERATIVE DIAGNOSIS:  Symptomatic cholelithiasis.   PROCEDURE:  Laparoscopic cholecystectomy with intraoperative  cholangiography.   SURGEON:  Velora Heckler, M.D.   ASSISTANT:  Leonie Man, M.D.   ANESTHESIA:  General.   ESTIMATED BLOOD LOSS:  Minimal.   PREPARATION:  Betadine.   COMPLICATIONS:  None.   INDICATIONS:  The patient is a 65 year old white female referred by Dr. Victorino Dike for symptomatic cholelithiasis.  The patient has had known  gallstones since 1999.  She has had intermittent symptoms.  Ultrasound in  February of 2005 at [facility] documented at 1.6 cm  gallstone.  The patient now comes to surgery for cholecystectomy.   BODY OF REPORT:  The procedure was done in OR #16 at the [facility].  The patient is brought to the operating room and placed in a  supine position on the operating room table.  Following administration of  general anesthesia, the patient was prepped and draped in the usual strict  aseptic fashion.  After ascertaining than an adequate level of anesthesia  had been obtained, an infraumbilical incision was made with a #15 blade.  Dissection was carried down through the subcutaneous tissues.  The fascia  was incised in the midline and the peritoneal cavity was entered cautiously.  A 0 Vicryl pursestring suture was placed in the fascia.  A Hasson cannula  was introduced and the abdomen insufflated with carbon dioxide.  The  laparoscope was introduced and the abdomen explored.  There were a few  adhesions to the lower midline incision.  The liver is slightly fibrotic but  without true cirrhosis.  The operative ports were placed along the right  costal margin, the midline, mid clavicular line, and anterior axillary line.  The fundus of the gallbladder was grasped and retracted cephalad.  Dissection was begun at the neck of the gallbladder.  The cystic duct is  dissected out along its length.  A clip is placed at the neck of the  gallbladder.  The cystic artery is dissected out, doubly clipped and  divided.  The cystic duct is then incised.  Clear yellow bile emanates from  the cystic duct.  Cook cholangiography catheter was introduced through a  stab wound in the right upper quadrant and inserted into the cystic duct.  It is secured with a Ligaclip.  Using C-arm fluoroscopy, real time  cholangiography is performed.  There is rapid flow of contrast into the  common bile duct.  It is normal caliber.  There is free flow distally into  the duodenum without filling defect or obstruction.  There is  reflux with  contrast into the right and left hepatic ductal systems.  The clip is  withdrawn and the [facility] catheter is removed from the peritoneal cavity.  The  cystic duct is triply clipped and divided.  The gallbladder is then  dissected out of the gallbladder bed using the hook electrocautery for  hemostasis.  There is either an accessory duct or small arterial branch high  in the gallbladder bed.  This is doubly clipped and divided.  The  gallbladder was then completely excised from the gallbladder bed using the  hook electrocautery for hemostasis.  The gallbladder is extracted through  the umbilical port without difficulty.  A 0 Vicryl pursestring suture is  tied securely.  The abdomen is irrigated with warm saline which is  evacuated.  The pneumoperitoneum is released.  The ports are removed.  There  is good hemostasis noted at all port sites.  The wounds are anesthetized  with local  anesthetic.  All wounds are closed with interrupted 4-0 Vicryl  subcuticular sutures.  The wounds are washed and dried and Benzoin and Steri-  Strips are applied.  Sterile dressings are applied.  The patient is awakened  from anesthesia and brought to the recovery room in stable condition.  The  patient tolerated the procedure well.      Todd   TMG/MEDQ  D:  08/16/2004  T:  08/16/2004  Job:  [id number]   cc:   Ulyess Mort, M.D. [facility]   Teena Irani. Arlyce Dice, M.D.  [address]  [place]  [place] [address]  Fax: [phone]

## 2010-12-21 NOTE — Discharge Summary (Signed)
[place]  Patient:    Gwendolyn Alvarado, Gwendolyn Alvarado Visit Number: [id number] MRN: [id number]          Service Type: MED Location: [id number] Attending Physician:  Emeterio Reeve Dictated by:   Payton Doughty, M.D. Admit Date:  10/16/2001 Discharge Date: 10/22/2001                             Discharge Summary  ADMISSION DIAGNOSIS:  Dilantin reaction with rash.  DISCHARGE DIAGNOSIS:  Dilantin reaction with rash.  OPERATIONS/PROCEDURES:  None.  SERVICE:  Neurosurgery.  HISTORY OF PRESENT ILLNESS:  This is a 65 year old white female whose history and physical is recounted in the chart.  She was started on Dilantin a couple of weeks ago, developed a rash and was switched to Tegretol. The rash continued.  She was on Decadron at home and had some improvement in the rash; however, tapered the Decadron and she has had an increase in the rash with blisters on her face and a bump on her head and she was admitted for IV Decadron.  PAST MEDICAL HISTORY:  Benign.  ALLERGIES:  DILANTIN.  PHYSICAL EXAMINATION:  General exam was remarkable for rash on her face, trunk and extremities; none on the soles of her feet and palms of her hand. She had one blister on the inside of her lower lip.  HOSPITAL COURSE:  She was admitted.  Sed rate was 3. White count was 13. She was afebrile.  Liver functions were within normal limits save for mild elevation in the ALT. Bilirubin was normal.  She was admitted, placed on Decadron.  She was on 10 mg IV q.6h., that was switched to p.o. q.8h.  She had a slight recurrence of her rash.  She was visited by dermatologist whose recommendation was for 10 mg p.o. q.6h. as well as __________ ointment.  This was continued without difficulty.  Marlan Palau, M.D., of neurology service, also good enough to see her. He said he would like her to be off all medications and then he would check an EEG in a couple of weeks. Currently she has  some diminution of her rash, although it is still present.  Itching is handled with Benadryl.  She is being discharged home on 10 mg p.o. q.6h.   We are going to leave her on that for a week and then begin tapering it. Dictated by:   Payton Doughty, M.D. Attending Physician:  Emeterio Reeve DD:  10/22/01 TD:  10/23/01 Job: [id number]

## 2010-12-21 NOTE — Assessment & Plan Note (Signed)
[facility]                         GASTROENTEROLOGY OFFICE NOTE   NAME:Gwendolyn Alvarado, Gwendolyn Alvarado                          MRN:          [id number]  DATE:06/27/2006                            DOB:          1946-03-24    Patient comes in, describes some mucus in her stool, five to six bowel  movements a day, starts out formed, then becomes watery, may not have a  bowel movement prior.  She feels like something should not be there,  especially in the right lower quadrant.  Feels it more when she is  sitting and driving.  She has not seen any blood or dark stools.   PHYSICAL EXAMINATION:  VITAL SIGNS:  Weight 146, blood pressure 118/70,  pulse 80 and regular.  HEENT:  Oropharynx was negative.  NECK:  Negative.  CHEST:  Clear.  HEART:  Revealed a regular rhythm without significant murmur.  ABDOMEN:  Soft, no masses or organomegaly.  Basically nontender.  EXTREMITIES:  Unremarkable.  RECTAL:  Deferred.   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  She also has had a  moderate degree of __________  in the past.   I talked to [facility] a long time about tortuous colon and why her bowel  movements started firm and then liquid and how the liquid is going from  the right colon on.  Hopefully, she has some insight into why these  things are happening and it is not necessarily a matter of obstructive  component.  I really tried to discourage her from having another  procedure so soon.  She is certainly not due yet and I really do not  think her symptoms warrant this as of yet.  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  One thing she takes now is some Depakote, AcipHex  and Colestid and Centrum Silver.  Because of the severe ongoing  gastritis she has, I really think she ought to stay on some Carafate.  I  wrote her a  prescription for this.  Because of her symptoms, I thought  we would relieve her anxiety by doing another CT of the abdomen and  pelvis and there was a mention about a small lesion, most likely benign,  over her right kidney, and I just thought followup would be important  because of the discomfort she was noting, but what she describes is  really right lower quadrant pain.  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     Ulyess Mort, MD  Electronically Signed    SML/MedQ  DD: 06/27/2006  DT: 06/27/2006  Job #: [id number]

## 2011-01-25 ENCOUNTER — Telehealth: Payer: Self-pay | Admitting: Internal Medicine

## 2011-01-25 NOTE — Telephone Encounter (Signed)
Pt has a-fib and wants to go to cardiac rehab with her husband, to be set up next week, needs referral from dr Ladona Ridgel

## 2011-01-25 NOTE — Telephone Encounter (Signed)
Spoke with patient and she states that they told her it will be $68 a month for her to go I explained to her that I did not think that this was the case  I called over and spoke with Byrd Hesselbach  The patient has a dx of afib  She as well as myself do not think that this will work  I have called the patient back and explained  She can call the rehab and ask more if she would like

## 2011-04-21 ENCOUNTER — Other Ambulatory Visit: Payer: Self-pay | Admitting: Internal Medicine

## 2011-05-01 LAB — VALPROIC ACID LEVEL: Valproic Acid Lvl: 92.7

## 2011-05-01 LAB — POCT I-STAT, CHEM 8
BUN: 21
Calcium, Ion: 1.13
Chloride: 105
Creatinine, Ser: 0.9
Glucose, Bld: 106 — ABNORMAL HIGH
HCT: 44
Hemoglobin: 15
Potassium: 4.5
Sodium: 140
TCO2: 29

## 2011-05-01 LAB — POCT CARDIAC MARKERS
CKMB, poc: <1 — ABNORMAL LOW
Myoglobin, poc: 38.6
Operator id: 270651
Troponin i, poc: <0.05

## 2011-05-07 ENCOUNTER — Ambulatory Visit: Payer: BC Managed Care – PPO | Admitting: Internal Medicine

## 2011-05-17 LAB — I-STAT 8, (EC8 V) (CONVERTED LAB)
Acid-Base Excess: 2
BUN: 12
Bicarbonate: 28.9 — ABNORMAL HIGH
Chloride: 105
Glucose, Bld: 95
HCT: 49 — ABNORMAL HIGH
Hemoglobin: 16.7 — ABNORMAL HIGH
Operator id: 272551
Potassium: 3.8
Sodium: 139
TCO2: 30
pCO2, Ven: 49.6
pH, Ven: 7.372 — ABNORMAL HIGH

## 2011-05-17 LAB — POCT CARDIAC MARKERS
CKMB, poc: <1 — ABNORMAL LOW
Myoglobin, poc: 50.6
Operator id: 196461
Troponin i, poc: <0.05

## 2011-05-17 LAB — VALPROIC ACID LEVEL: Valproic Acid Lvl: 75.9

## 2011-06-04 ENCOUNTER — Ambulatory Visit: Payer: BC Managed Care – PPO | Admitting: Internal Medicine

## 2011-07-02 ENCOUNTER — Encounter: Payer: Self-pay | Admitting: Internal Medicine

## 2011-07-02 ENCOUNTER — Ambulatory Visit (INDEPENDENT_AMBULATORY_CARE_PROVIDER_SITE_OTHER): Payer: Medicare Other | Admitting: Internal Medicine

## 2011-07-02 DIAGNOSIS — I4891 Unspecified atrial fibrillation: Secondary | ICD-10-CM

## 2011-07-02 DIAGNOSIS — I10 Essential (primary) hypertension: Secondary | ICD-10-CM

## 2011-07-02 MED ORDER — FLECAINIDE ACETATE 50 MG PO TABS: 50.0000 mg | ORAL_TABLET | Freq: Two times a day (BID) | ORAL | Status: DC

## 2011-07-02 MED ORDER — METOPROLOL SUCCINATE ER 25 MG PO TB24: 25.0000 mg | ORAL_TABLET | Freq: Every day | ORAL | Status: DC

## 2011-07-02 NOTE — Assessment & Plan Note (Signed)
The patient is maintaining sinus rhythm very nicely on her current antiarrhythmic medications. She will continue her medications.

## 2011-07-02 NOTE — Patient Instructions (Signed)
Your physician wants you to follow-up in: 12 months with Dr. Taylor. You will receive a reminder letter in the mail two months in advance. If you don't receive a letter, please call our office to schedule the follow-up appointment.    

## 2011-07-02 NOTE — Progress Notes (Signed)
HPI [name] returns today for followup. She is a very pleasant 65 year old woman with paroxysmal atrial for ablation. She has been maintained in sinus rhythm very nicely on flecainide. She denies palpitations, chest pain, or shortness of breath. No peripheral edema or syncope. Allergies  Allergen Reactions  . Carbamazepine   . Hydrocodone-Acetaminophen   . Penicillins   . Phenytoin      Current Outpatient Prescriptions  Medication Sig Dispense Refill  . aspirin 325 MG EC tablet Take 325 mg by mouth daily.        Marland Kitchen dexlansoprazole (DEXILANT) 60 MG capsule Take 60 mg by mouth 2 (two) times daily.        . flecainide (TAMBOCOR) 50 MG tablet Take 1 tablet (50 mg total) by mouth 2 (two) times daily.  180 tablet  3  . Lacosamide (VIMPAT) 150 MG TABS Take 1 tablet by mouth 2 (two) times daily.        Marland Kitchen DISCONTD: flecainide (TAMBOCOR) 50 MG tablet TAKE 1 TABLET BY MOUTH BY MOUTH EVERY 12 HOURS  60 tablet  11  . metoprolol succinate (TOPROL-XL) 25 MG 24 hr tablet Take 1 tablet (25 mg total) by mouth daily.  90 tablet  3  . DISCONTD: metoprolol succinate (TOPROL-XL) 25 MG 24 hr tablet TAKE 1 TABLET BY MOUTH EVERY DAY  30 tablet  11     Past Medical History  Diagnosis Date  . Angioma cavernosum   . HTN (hypertension)   . Atrial fibrillation   . Irritable bowel syndrome (IBS)   . Seizure disorder     ROS:   All systems reviewed and negative except as noted in the HPI.   Past Surgical History  Procedure Date  . Benign breast biopsy   . Salivary gland surgery   . Bartholins gland cyst resection      Family History  Problem Relation Age of Onset  . Stroke Mother   . Lung cancer Mother   . Seizures Neg Hx      History   Social History  . Marital Status: Married    Spouse Name: N/A    Number of Children: 3  . Years of Education: N/A   Occupational History  . Not on file.   Social History Main Topics  . Smoking status: Never Smoker   . Smokeless tobacco: Not on file    . Alcohol Use: No  . Drug Use: No  . Sexually Active: Not on file   Other Topics Concern  . Not on file   Social History Narrative  . No narrative on file     BP 122/70  Pulse 69  Ht 5' 6.5" (1.689 m)  Wt 66.225 kg (146 lb)  BMI 23.21 kg/m2  Physical Exam:  Well appearing middle-aged woman, NAD HEENT: Unremarkable Neck:  No JVD, no thyromegally Lungs:  Clear no wheezes, rales, or rhonchi. HEART:  Regular rate rhythm, no murmurs, no rubs, no clicks Abd:  soft, positive bowel sounds, no organomegally, no rebound, no guarding Ext:  2 plus pulses, no edema, no cyanosis, no clubbing Skin:  No rashes no nodules Neuro:  CN II through XII intact, motor grossly intact  ECG - NSR.  Assess/Plan:

## 2011-07-02 NOTE — Assessment & Plan Note (Signed)
Her blood pressure is very well controlled. I'm not completely certain that she even has hypertension. She will however continue her beta blocker to help control her ventricular rate when she goes into atrial fibrillation.

## 2011-07-12 ENCOUNTER — Ambulatory Visit: Payer: BC Managed Care – PPO | Admitting: Internal Medicine

## 2011-07-12 DIAGNOSIS — M26629 Arthralgia of temporomandibular joint, unspecified side: Secondary | ICD-10-CM | POA: Insufficient documentation

## 2011-07-12 DIAGNOSIS — K219 Gastro-esophageal reflux disease without esophagitis: Secondary | ICD-10-CM | POA: Insufficient documentation

## 2011-08-20 ENCOUNTER — Other Ambulatory Visit: Payer: Self-pay | Admitting: Family Medicine

## 2011-08-20 DIAGNOSIS — Z1231 Encounter for screening mammogram for malignant neoplasm of breast: Secondary | ICD-10-CM

## 2011-09-30 ENCOUNTER — Ambulatory Visit: Payer: Medicare Other

## 2011-10-07 ENCOUNTER — Ambulatory Visit: Payer: Medicare Other

## 2011-10-22 ENCOUNTER — Ambulatory Visit
Admission: RE | Admit: 2011-10-22 | Discharge: 2011-10-22 | Disposition: A | Discharge status: Home / Self Care | Payer: Medicare Other | Source: Ambulatory Visit | Attending: Family Medicine | Admitting: Family Medicine

## 2011-10-22 DIAGNOSIS — Z1231 Encounter for screening mammogram for malignant neoplasm of breast: Secondary | ICD-10-CM

## 2011-11-25 ENCOUNTER — Telehealth: Payer: Self-pay | Admitting: Internal Medicine

## 2011-11-25 NOTE — Telephone Encounter (Signed)
LMTCB

## 2011-11-25 NOTE — Telephone Encounter (Signed)
Metoprolol and flecinide written rx [place]

## 2011-11-27 ENCOUNTER — Other Ambulatory Visit: Payer: Self-pay | Admitting: *Deleted

## 2011-11-27 DIAGNOSIS — I4891 Unspecified atrial fibrillation: Secondary | ICD-10-CM

## 2011-11-27 DIAGNOSIS — I10 Essential (primary) hypertension: Secondary | ICD-10-CM

## 2011-11-27 MED ORDER — METOPROLOL SUCCINATE ER 25 MG PO TB24: 25.0000 mg | ORAL_TABLET | Freq: Every day | ORAL | Status: DC

## 2011-11-27 MED ORDER — FLECAINIDE ACETATE 50 MG PO TABS: 50.0000 mg | ORAL_TABLET | Freq: Two times a day (BID) | ORAL | Status: DC

## 2011-11-27 NOTE — Telephone Encounter (Signed)
Done and will have the PA sign today in Dr Lubertha Basque absence

## 2011-11-27 NOTE — Telephone Encounter (Signed)
Follow- up: ° ° °Patient returned your phone call. Please call back. °

## 2011-11-27 NOTE — Telephone Encounter (Signed)
This is Dr Lubertha Basque patient. I will forward to [place].

## 2011-11-28 ENCOUNTER — Telehealth: Payer: Self-pay | Admitting: Internal Medicine

## 2011-11-28 DIAGNOSIS — I4891 Unspecified atrial fibrillation: Secondary | ICD-10-CM

## 2011-11-28 DIAGNOSIS — I10 Essential (primary) hypertension: Secondary | ICD-10-CM

## 2011-11-28 MED ORDER — FLECAINIDE ACETATE 50 MG PO TABS: 50.0000 mg | ORAL_TABLET | Freq: Two times a day (BID) | ORAL | Status: DC

## 2011-11-28 MED ORDER — METOPROLOL SUCCINATE ER 25 MG PO TB24: 25.0000 mg | ORAL_TABLET | Freq: Every day | ORAL | Status: DC

## 2011-11-28 NOTE — Telephone Encounter (Signed)
New Problem:     The patient will be leaving for [place] early Sunday morning and would like to receive written prescriptions for her metoprolol succinate (TOPROL-XL) 25 MG 24 hr tablet and flecainide (TAMBOCOR) 50 MG tablet before the end of tomorrow so she can take them with her, just in case her medication is either lost or stolen. Please call back.

## 2011-11-28 NOTE — Telephone Encounter (Signed)
lmom for pt to call me back.  I had rx written and put to mail to pt yesterday but can send to drug store if needed.  I did not realize she was leaving so soon

## 2012-05-22 ENCOUNTER — Other Ambulatory Visit: Payer: Self-pay | Admitting: Internal Medicine

## 2012-06-10 ENCOUNTER — Emergency Department (HOSPITAL_COMMUNITY): Payer: Medicare Other

## 2012-06-10 ENCOUNTER — Emergency Department (HOSPITAL_COMMUNITY)
Admission: EM | Admit: 2012-06-10 | Discharge: 2012-06-10 | Disposition: A | Discharge status: Home / Self Care | Payer: Medicare Other | Attending: Emergency Medicine | Admitting: Emergency Medicine

## 2012-06-10 ENCOUNTER — Encounter (HOSPITAL_COMMUNITY): Payer: Self-pay | Admitting: Family Medicine

## 2012-06-10 DIAGNOSIS — Z8669 Personal history of other diseases of the nervous system and sense organs: Secondary | ICD-10-CM | POA: Insufficient documentation

## 2012-06-10 DIAGNOSIS — Z79899 Other long term (current) drug therapy: Secondary | ICD-10-CM | POA: Insufficient documentation

## 2012-06-10 DIAGNOSIS — W1809XA Striking against other object with subsequent fall, initial encounter: Secondary | ICD-10-CM | POA: Insufficient documentation

## 2012-06-10 DIAGNOSIS — W19XXXA Unspecified fall, initial encounter: Secondary | ICD-10-CM

## 2012-06-10 DIAGNOSIS — I10 Essential (primary) hypertension: Secondary | ICD-10-CM | POA: Insufficient documentation

## 2012-06-10 DIAGNOSIS — Y939 Activity, unspecified: Secondary | ICD-10-CM | POA: Insufficient documentation

## 2012-06-10 DIAGNOSIS — S0100XA Unspecified open wound of scalp, initial encounter: Secondary | ICD-10-CM | POA: Insufficient documentation

## 2012-06-10 DIAGNOSIS — R569 Unspecified convulsions: Secondary | ICD-10-CM | POA: Insufficient documentation

## 2012-06-10 DIAGNOSIS — Z7982 Long term (current) use of aspirin: Secondary | ICD-10-CM | POA: Insufficient documentation

## 2012-06-10 DIAGNOSIS — S0101XA Laceration without foreign body of scalp, initial encounter: Secondary | ICD-10-CM

## 2012-06-10 DIAGNOSIS — K589 Irritable bowel syndrome without diarrhea: Secondary | ICD-10-CM | POA: Insufficient documentation

## 2012-06-10 DIAGNOSIS — I4891 Unspecified atrial fibrillation: Secondary | ICD-10-CM | POA: Insufficient documentation

## 2012-06-10 DIAGNOSIS — Y929 Unspecified place or not applicable: Secondary | ICD-10-CM | POA: Insufficient documentation

## 2012-06-10 MED ORDER — ACETAMINOPHEN 325 MG PO TABS
650.0000 mg | ORAL_TABLET | Freq: Once | ORAL | Status: AC
  Administered 2012-06-10: 650 mg via ORAL
  Filled 2012-06-10: qty 2

## 2012-06-10 NOTE — ED Provider Notes (Signed)
History     CSN: [id number]  Arrival date & time 06/10/12  1716   First MD Initiated Contact with Patient 06/10/12 1734      Chief Complaint  Patient presents with  . Fall     HPI Pt fell PTA and hit head on a brick. Small lac to right side of head. Bleeding controlled. Pt hx of angioma cavernosum. Denies nausea, vomiting.denies vision loss or changes.   Past Medical History  Diagnosis Date  . Angioma cavernosum   . HTN (hypertension)   . Atrial fibrillation   . Irritable bowel syndrome (IBS)   . Seizure disorder     Past Surgical History  Procedure Date  . Benign breast biopsy   . Salivary gland surgery   . Bartholins gland cyst resection     Family History  Problem Relation Age of Onset  . Stroke Mother   . Lung cancer Mother   . Seizures Neg Hx     History  Substance Use Topics  . Smoking status: Never Smoker   . Smokeless tobacco: Not on file  . Alcohol Use: No    OB History    Grav Para Term Preterm Abortions TAB SAB Ect Mult Living                  Review of Systems All other systems reviewed and are negative Allergies  Carbamazepine; Hydrocodone-acetaminophen; Penicillins; and Phenytoin  Home Medications   Current Outpatient Rx  Name  Route  Sig  Dispense  Refill  . ASPIRIN 325 MG PO TBEC   Oral   Take 325 mg by mouth daily.           . DEXLANSOPRAZOLE 60 MG PO CPDR   Oral   Take 60 mg by mouth 2 (two) times daily.           Marland Kitchen FLECAINIDE ACETATE 50 MG PO TABS   Oral   Take 1 tablet (50 mg total) by mouth 2 (two) times daily.   60 tablet   3   . LACOSAMIDE 150 MG PO TABS   Oral   Take 1 tablet by mouth 2 (two) times daily.           Marland Kitchen METOPROLOL SUCCINATE ER 25 MG PO TB24   Oral   Take 1 tablet (25 mg total) by mouth daily.   30 tablet   3     BP 153/80  Pulse 76  Temp 98 F (36.7 C)  Resp 18  SpO2 100%  Physical Exam  Nursing note and vitals reviewed. Constitutional: She is oriented to person, place, and  time. She appears well-developed and well-nourished. No distress.  HENT:  Head: Normocephalic.         1 cm laceration with no active bleeding  Eyes: Pupils are equal, round, and reactive to light.  Neck: Normal range of motion.  Cardiovascular: Normal rate and intact distal pulses.   Pulmonary/Chest: No respiratory distress.  Abdominal: Normal appearance. She exhibits no distension.  Musculoskeletal: Normal range of motion.  Neurological: She is alert and oriented to person, place, and time. No cranial nerve deficit.  Skin: Skin is warm and dry. No rash noted.  Psychiatric: She has a normal mood and affect. Her behavior is normal.    ED Course  Procedures (including critical care time) LACERATION REPAIR Performed by: Nelia Shi Authorized by: Nelia Shi Consent: Verbal consent obtained. Risks and benefits: risks, benefits and alternatives were discussed Consent given by: patient  Patient identity confirmed: provided demographic data Prepped and Draped in normal sterile fashion Wound explored  Laceration Location:  right temple   Laceration Length:  1 cm  No Foreign Bodies seen or palpated  Anesthesia: local infiltration  Local anesthetic: lidocaine  to % with epinephrine  Anesthetic total:  3  ml  Irrigation method:  of cleaning: standard  Skin closure: Staples   Number of sutures:2  Technique: Interrupted   Patient tolerance: Patient tolerated the procedure well with no immediate complications. Labs Reviewed - No data to display Dg Cervical Spine Complete  06/10/2012  *RADIOLOGY REPORT*  Clinical Data: Fall, neck pain  CERVICAL SPINE - COMPLETE 4+ VIEW  Comparison: None  Findings: No prevertebral soft tissue swelling.  Normal alignment of the cervical vertebral bodies.  There is joint space narrowing C6-C7.  Normal spinal laminar line.  Open mouth odontoid view is partially obscured by the patient's teeth.  The lateral masses appear aligned.  IMPRESSION:  No radiographic evidence cervical spine fracture.   Original Report Authenticated By: Genevive Bi, M.D.    Ct Head Wo Contrast  06/10/2012  *RADIOLOGY REPORT*  Clinical Data: Larey Seat, striking the head on a brick.  Laceration to the right side of the head.  Known left temporal cavernous angioma.  CT HEAD WITHOUT CONTRAST  Technique:  Contiguous axial images were obtained from the base of the skull through the vertex without contrast.  Comparison: MRI brain 10/03/2009, 01/26/2008, 01/13/2007.  Findings: Ventricular system normal in size and appearance for age. Note made of small cysts in the choroid plexus of the lateral ventricles.  Approximate 1.6 x 1.2 cm high attenuation lesion in the medial left temporal lobe, consistent with the known cavernous angioma, unchanged.  No new mass lesion.  No midline shift.  No acute hemorrhage or hematoma.  No extra-axial fluid collections. No evidence of acute infarction.  Laceration to the right frontoparietal scalp approaching the vertex.  No underlying skull fracture. Visualized paranasal sinuses, bilateral mastoid air cells, and bilateral middle ear cavities well-aerated.  IMPRESSION:  1.  No acute intracranial abnormality. 2.  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 3.  Right frontoparietal scalp laceration without underlying skull fracture.   Original Report Authenticated By: Hulan Saas, M.D.      1. Fall   2. Scalp laceration       MDM          Nelia Shi, MD 06/10/12 2027

## 2012-06-10 NOTE — ED Notes (Signed)
Pt fell PTA and hit head on a brick. Small lac to right side of head. Bleeding controlled. Pt hx of angioma cavernosum. Denies nausea, vomiting.denies vision loss or changes.

## 2012-07-07 ENCOUNTER — Encounter: Payer: Self-pay | Admitting: Internal Medicine

## 2012-07-07 ENCOUNTER — Ambulatory Visit (INDEPENDENT_AMBULATORY_CARE_PROVIDER_SITE_OTHER): Payer: Medicare Other | Admitting: Internal Medicine

## 2012-07-07 VITALS — BP 106/64 | HR 72 | Ht 67.0 in | Wt 148.0 lb

## 2012-07-07 DIAGNOSIS — I4891 Unspecified atrial fibrillation: Secondary | ICD-10-CM

## 2012-07-07 DIAGNOSIS — I10 Essential (primary) hypertension: Secondary | ICD-10-CM

## 2012-07-07 MED ORDER — ASPIRIN EC 81 MG PO TBEC: 81.0000 mg | DELAYED_RELEASE_TABLET | Freq: Every day | ORAL | Status: DC

## 2012-07-07 NOTE — Assessment & Plan Note (Signed)
She is maintaining sinus rhythm very nicely on flecainide therapy. She will continue her current medications. We will reduce her dose of aspirin 81 mg daily.

## 2012-07-07 NOTE — Assessment & Plan Note (Signed)
Her blood pressure is well controlled. No medical therapy changes today.

## 2012-07-07 NOTE — Patient Instructions (Signed)
Your physician wants you to follow-up in: 12 months with Dr Stevan Born will receive a reminder letter in the mail two months in advance. If you don't receive a letter, please call our office to schedule the follow-up appointment.   Decrease Aspirin to 81mg  daily

## 2012-07-07 NOTE — Progress Notes (Signed)
HPI Mrs. Bachicha returns today for followup. She is a pleasant 66 yo woman with PAF, HTN, and a recent fall. In the interim she has done well except for the fall. She denies any symptomatic atrial fibrillation. She continues to take twice daily flecainide. Allergies  Allergen Reactions  . Carbamazepine   . Hydrocodone-Acetaminophen   . Penicillins   . Phenytoin      Current Outpatient Prescriptions  Medication Sig Dispense Refill  . dexlansoprazole (DEXILANT) 60 MG capsule Take 60 mg by mouth 2 (two) times daily.        . flecainide (TAMBOCOR) 50 MG tablet Take 1 tablet (50 mg total) by mouth 2 (two) times daily.  60 tablet  3  . Lacosamide (VIMPAT) 150 MG TABS Take 1 tablet by mouth 2 (two) times daily.        . metoprolol succinate (TOPROL-XL) 25 MG 24 hr tablet Take 1 tablet (25 mg total) by mouth daily.  30 tablet  3  . aspirin EC 81 MG tablet Take 1 tablet (81 mg total) by mouth daily.  90 tablet  3     Past Medical History  Diagnosis Date  . Angioma cavernosum   . HTN (hypertension)   . Atrial fibrillation   . Irritable bowel syndrome (IBS)   . Seizure disorder     ROS:   All systems reviewed and negative except as noted in the HPI.   Past Surgical History  Procedure Date  . Benign breast biopsy   . Salivary gland surgery   . Bartholins gland cyst resection      Family History  Problem Relation Age of Onset  . Stroke Mother   . Lung cancer Mother   . Seizures Neg Hx      History   Social History  . Marital Status: Married    Spouse Name: N/A    Number of Children: 3  . Years of Education: N/A   Occupational History  . Not on file.   Social History Main Topics  . Smoking status: Never Smoker   . Smokeless tobacco: Not on file  . Alcohol Use: No  . Drug Use: No  . Sexually Active: Not on file   Other Topics Concern  . Not on file   Social History Narrative  . No narrative on file     BP 106/64  Pulse 72  Ht 5\' 7"  (1.702 m)  Wt 148 lb  (67.132 kg)  BMI 23.18 kg/m2  Physical Exam:  Well appearing 66 year old woman, NAD HEENT: Unremarkable Neck:  No JVD, no thyromegally Lungs:  Clear with no wheezes, rales, or rhonchi. HEART:  Regular rate rhythm, no murmurs, no rubs, no clicks Abd:  soft, positive bowel sounds, no organomegally, no rebound, no guarding Ext:  2 plus pulses, no edema, no cyanosis, no clubbing Skin:  No rashes no nodules Neuro:  CN II through XII intact, motor grossly intact  EKG Normal sinus rhythm  Assess/Plan:

## 2012-07-27 ENCOUNTER — Other Ambulatory Visit: Payer: Self-pay | Admitting: Internal Medicine

## 2012-09-16 ENCOUNTER — Other Ambulatory Visit: Payer: Self-pay

## 2012-09-30 ENCOUNTER — Other Ambulatory Visit: Payer: Self-pay | Admitting: Internal Medicine

## 2012-09-30 NOTE — Telephone Encounter (Signed)
Fax Received. Refill Completed. Gwendolyn Alvarado (R.M.A) Pt called in wanting to get her Flecainide refilled

## 2012-10-28 ENCOUNTER — Other Ambulatory Visit: Payer: Self-pay | Admitting: Internal Medicine

## 2012-11-02 ENCOUNTER — Telehealth: Payer: Self-pay | Admitting: Internal Medicine

## 2012-11-02 ENCOUNTER — Other Ambulatory Visit: Payer: Self-pay | Admitting: *Deleted

## 2012-11-02 DIAGNOSIS — I4891 Unspecified atrial fibrillation: Secondary | ICD-10-CM

## 2012-11-02 DIAGNOSIS — I10 Essential (primary) hypertension: Secondary | ICD-10-CM

## 2012-11-02 MED ORDER — METOPROLOL SUCCINATE ER 25 MG PO TB24: 25.0000 mg | ORAL_TABLET | Freq: Every day | ORAL | Status: DC

## 2012-11-02 NOTE — Telephone Encounter (Signed)
Spoke to patient she stated she needed refill on metoprolol.Refill sent to [facility] in [place].

## 2012-11-02 NOTE — Telephone Encounter (Signed)
New problem   Pt need new prescription for Metropolol 25mg . Pt stated pharmacy has faxed over request several times but no one has replied back. Pt would like to speak to a nurse concerning this matter

## 2012-11-02 NOTE — Telephone Encounter (Signed)
New problem ° ° °Pt need new prescription for Metropolol 25mg. Pt stated pharmacy has faxed over request several times but no one has replied back. Pt would like to speak to a nurse concerning this matter °

## 2013-01-29 DIAGNOSIS — D1771 Benign lipomatous neoplasm of kidney: Secondary | ICD-10-CM | POA: Insufficient documentation

## 2013-01-29 DIAGNOSIS — N200 Calculus of kidney: Secondary | ICD-10-CM | POA: Insufficient documentation

## 2013-04-14 ENCOUNTER — Other Ambulatory Visit: Payer: Self-pay

## 2013-04-14 DIAGNOSIS — Z1231 Encounter for screening mammogram for malignant neoplasm of breast: Secondary | ICD-10-CM

## 2013-04-26 ENCOUNTER — Ambulatory Visit
Admission: RE | Admit: 2013-04-26 | Discharge: 2013-04-26 | Disposition: A | Discharge status: Home / Self Care | Payer: 59 | Source: Ambulatory Visit

## 2013-04-26 DIAGNOSIS — Z1231 Encounter for screening mammogram for malignant neoplasm of breast: Secondary | ICD-10-CM

## 2013-05-10 ENCOUNTER — Ambulatory Visit: Payer: Medicare Other

## 2013-08-30 ENCOUNTER — Encounter (HOSPITAL_COMMUNITY): Payer: Self-pay | Admitting: Emergency Medicine

## 2013-08-30 ENCOUNTER — Emergency Department (HOSPITAL_COMMUNITY): Payer: Medicare Other

## 2013-08-30 DIAGNOSIS — G40909 Epilepsy, unspecified, not intractable, without status epilepticus: Secondary | ICD-10-CM | POA: Insufficient documentation

## 2013-08-30 DIAGNOSIS — S0083XA Contusion of other part of head, initial encounter: Principal | ICD-10-CM

## 2013-08-30 DIAGNOSIS — Y9289 Other specified places as the place of occurrence of the external cause: Secondary | ICD-10-CM | POA: Insufficient documentation

## 2013-08-30 DIAGNOSIS — IMO0002 Reserved for concepts with insufficient information to code with codable children: Secondary | ICD-10-CM | POA: Insufficient documentation

## 2013-08-30 DIAGNOSIS — I4891 Unspecified atrial fibrillation: Secondary | ICD-10-CM | POA: Insufficient documentation

## 2013-08-30 DIAGNOSIS — S0003XA Contusion of scalp, initial encounter: Secondary | ICD-10-CM | POA: Insufficient documentation

## 2013-08-30 DIAGNOSIS — Q825 Congenital non-neoplastic nevus: Secondary | ICD-10-CM | POA: Insufficient documentation

## 2013-08-30 DIAGNOSIS — Z8719 Personal history of other diseases of the digestive system: Secondary | ICD-10-CM | POA: Insufficient documentation

## 2013-08-30 DIAGNOSIS — S1093XA Contusion of unspecified part of neck, initial encounter: Principal | ICD-10-CM

## 2013-08-30 DIAGNOSIS — Z7982 Long term (current) use of aspirin: Secondary | ICD-10-CM | POA: Insufficient documentation

## 2013-08-30 DIAGNOSIS — Y939 Activity, unspecified: Secondary | ICD-10-CM | POA: Insufficient documentation

## 2013-08-30 DIAGNOSIS — Z79899 Other long term (current) drug therapy: Secondary | ICD-10-CM | POA: Insufficient documentation

## 2013-08-30 DIAGNOSIS — S139XXA Sprain of joints and ligaments of unspecified parts of neck, initial encounter: Secondary | ICD-10-CM | POA: Insufficient documentation

## 2013-08-30 DIAGNOSIS — S6990XA Unspecified injury of unspecified wrist, hand and finger(s), initial encounter: Secondary | ICD-10-CM | POA: Insufficient documentation

## 2013-08-30 DIAGNOSIS — Z880 Allergy status to penicillin: Secondary | ICD-10-CM | POA: Insufficient documentation

## 2013-08-30 DIAGNOSIS — R296 Repeated falls: Secondary | ICD-10-CM | POA: Insufficient documentation

## 2013-08-30 DIAGNOSIS — I10 Essential (primary) hypertension: Secondary | ICD-10-CM | POA: Insufficient documentation

## 2013-08-30 NOTE — ED Notes (Signed)
Patient was at [facility] and fell on some berries.  Hit her forehead, knees and hands

## 2013-08-31 ENCOUNTER — Emergency Department (HOSPITAL_COMMUNITY)
Admission: EM | Admit: 2013-08-31 | Discharge: 2013-08-31 | Disposition: A | Discharge status: Home / Self Care | Payer: Medicare Other | Attending: Emergency Medicine | Admitting: Emergency Medicine

## 2013-08-31 DIAGNOSIS — T148XXA Other injury of unspecified body region, initial encounter: Secondary | ICD-10-CM

## 2013-08-31 DIAGNOSIS — S161XXA Strain of muscle, fascia and tendon at neck level, initial encounter: Secondary | ICD-10-CM

## 2013-08-31 MED ORDER — BACITRACIN ZINC 500 UNIT/GM EX OINT: 1.0000 "application " | TOPICAL_OINTMENT | Freq: Two times a day (BID) | CUTANEOUS | Status: DC

## 2013-08-31 NOTE — ED Notes (Signed)
MD at bedside. 

## 2013-08-31 NOTE — Discharge Instructions (Signed)
Abrasion °An abrasion is a cut or scrape of the skin. Abrasions do not extend through all layers of the skin and most heal within 10 days. It is important to care for your abrasion properly to prevent infection. °CAUSES  °Most abrasions are caused by falling on, or gliding across, the ground or other surface. When your skin rubs on something, the outer and inner layer of skin rubs off, causing an abrasion. °DIAGNOSIS  °Your caregiver will be able to diagnose an abrasion during a physical exam.  °TREATMENT  °Your treatment depends on how large and deep the abrasion is. Generally, your abrasion will be cleaned with water and a mild soap to remove any dirt or debris. An antibiotic ointment may be put over the abrasion to prevent an infection. A bandage (dressing) may be wrapped around the abrasion to keep it from getting dirty.  °You may need a tetanus shot if: °· You cannot remember when you had your last tetanus shot. °· You have never had a tetanus shot. °· The injury broke your skin. °If you get a tetanus shot, your arm may swell, get red, and feel warm to the touch. This is common and not a problem. If you need a tetanus shot and you choose not to have one, there is a rare chance of getting tetanus. Sickness from tetanus can be serious.  °HOME CARE INSTRUCTIONS  °· If a dressing was applied, change it at least once a day or as directed by your caregiver. If the bandage sticks, soak it off with warm water.   °· Wash the area with water and a mild soap to remove all the ointment 2 times a day. Rinse off the soap and pat the area dry with a clean towel.   °· Reapply any ointment as directed by your caregiver. This will help prevent infection and keep the bandage from sticking. Use gauze over the wound and under the dressing to help keep the bandage from sticking.   °· Change your dressing right away if it becomes wet or dirty.   °· Only take over-the-counter or prescription medicines for pain, discomfort, or fever as  directed by your caregiver.   °· Follow up with your caregiver within 24 48 hours for a wound check, or as directed. If you were not given a wound-check appointment, look closely at your abrasion for redness, swelling, or pus. These are signs of infection. °SEEK IMMEDIATE MEDICAL CARE IF:  °· You have increasing pain in the wound.   °· You have redness, swelling, or tenderness around the wound.   °· You have pus coming from the wound.   °· You have a fever or persistent symptoms for more than 2 3 days. °· You have a fever and your symptoms suddenly get worse. °· You have a bad smell coming from the wound or dressing.   °MAKE SURE YOU:  °· Understand these instructions. °· Will watch your condition. °· Will get help right away if you are not doing well or get worse. °Document Released: 05/01/2005 Document Revised: 07/08/2012 Document Reviewed: 06/25/2011 °[employer]® Patient Information ©2014 [employer]. ° °

## 2013-08-31 NOTE — ED Provider Notes (Signed)
CSN: [id number]     Arrival date & time 08/30/13  1920 History   First MD Initiated Contact with Patient 08/31/13 0031     Chief Complaint  Patient presents with  . Fall  . Hit Head    (Consider location/radiation/quality/duration/timing/severity/associated sxs/prior Treatment) HPI This patient is a 68 yo woman who slipped on berries at a [employer] just PTA, fell forward and scraped both knees, and the right palm. She struck the right side of her head against floor. She denies LOC and has not had a headache or any other neurologic sx.   The patient has a history of a left temporal angioma and was advised by her daughter, a physician, to come to the ED. She is without complaints of pain in any region at this time. Td is utd.   Past Medical History  Diagnosis Date  . Angioma cavernosum   . HTN (hypertension)   . Atrial fibrillation   . Irritable bowel syndrome (IBS)   . Seizure disorder    Past Surgical History  Procedure Laterality Date  . Benign breast biopsy    . Salivary gland surgery    . Bartholins gland cyst resection     Family History  Problem Relation Age of Onset  . Stroke Mother   . Lung cancer Mother   . Seizures Neg Hx    History  Substance Use Topics  . Smoking status: Never Smoker   . Smokeless tobacco: Not on file  . Alcohol Use: No   OB History   Grav Para Term Preterm Abortions TAB SAB Ect Mult Living                 Review of Systems Ten point review of symptoms performed and is negative with the exception of symptoms noted above.   Allergies  Carbamazepine; Hydrocodone-acetaminophen; Penicillins; and Phenytoin  Home Medications   Current Outpatient Rx  Name  Route  Sig  Dispense  Refill  . aspirin EC 81 MG tablet   Oral   Take 1 tablet (81 mg total) by mouth daily.   90 tablet   3   . dexlansoprazole (DEXILANT) 60 MG capsule   Oral   Take 60 mg by mouth 2 (two) times daily.           . flecainide (TAMBOCOR) 50 MG tablet     TAKE 1 TABLET BY MOUTH EVERY 12 HOURS   60 tablet   12   . Lacosamide (VIMPAT) 150 MG TABS   Oral   Take 1 tablet by mouth 2 (two) times daily.           . metoprolol succinate (TOPROL-XL) 25 MG 24 hr tablet   Oral   Take 1 tablet (25 mg total) by mouth daily.   90 tablet   3    BP 105/61  Pulse 70  Temp(Src) 97.1 F (36.2 C) (Oral)  Resp 20  Ht 5\' 6"  (1.676 m)  Wt 148 lb (67.132 kg)  BMI 23.90 kg/m2  SpO2 100% Physical Exam Gen: well developed and well nourished appearing Head: Approximate 2 cm superficial abrasion over the superior lateral aspect of the right forehead, no hematoma. NCAT Eyes: PERL, EOMI Nose: no epistaixis or rhinorrhea Mouth/throat: mucosa is moist and pink Neck: supple, no stridor, no cervical spine tenderness, there is tenderness to palpation over the paraspinal musculature on the right Lungs: CTA B, no wheezing, rhonchi or rales Chest wall: No chest wall tenderness CV: RRR, no murmur,  extremities appear well perfused.  Abd: soft, notender Back: No tenderness on palpation of the spine Skin: warm and dry Ext: normal to inspection x for superficial abrasion over the left thenar eminence and superficial abrasion over the anterior aspect of both knees, no dependent edema Neuro: CN ii-xii grossly intact, no focal deficits Psyche; normal affect,  calm and cooperative.   ED Course  Procedures (including critical care time) Labs Review Labs Reviewed - No data to display Imaging Review Ct Head Wo Contrast  08/30/2013   CLINICAL DATA:  Fall with head injury. 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  EXAM: CT HEAD WITHOUT CONTRAST  TECHNIQUE: Contiguous axial images were obtained from the base of the skull through the vertex without intravenous contrast.  COMPARISON:  CT HEAD W/O CM dated 06/10/2012; MR HEAD WO/W CM dated 10/03/2009; MR HEAD WO/W CM dated 01/26/2008  FINDINGS: Stable size and appearance of the previously imaged medial left  temporal cavernous angioma. Maximal diameter is approximately 1.6 cm. No evidence acute hemorrhage. The brain demonstrates no evidence of infarction, edema, mass effect, extra-axial fluid collection, hydrocephalus or mass lesion. The skull is unremarkable.  IMPRESSION: No acute findings. 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   Electronically Signed   By: Aletta Edouard M.D.   On: 08/30/2013 20:09     MDM   1. Contusion   2. Abrasion   3. Strain of neck muscle        Elyn Peers, MD 08/31/13 [phone]

## 2013-09-30 ENCOUNTER — Other Ambulatory Visit: Payer: Self-pay | Admitting: Internal Medicine

## 2013-10-12 ENCOUNTER — Ambulatory Visit: Payer: Medicare Other | Admitting: Internal Medicine

## 2013-11-06 ENCOUNTER — Other Ambulatory Visit: Payer: Self-pay | Admitting: Internal Medicine

## 2013-11-12 ENCOUNTER — Other Ambulatory Visit: Payer: Self-pay

## 2013-11-16 ENCOUNTER — Ambulatory Visit (INDEPENDENT_AMBULATORY_CARE_PROVIDER_SITE_OTHER): Payer: Medicare Other | Admitting: Internal Medicine

## 2013-11-16 ENCOUNTER — Encounter: Payer: Self-pay | Admitting: Internal Medicine

## 2013-11-16 VITALS — BP 119/70 | HR 66 | Ht 67.0 in | Wt 153.0 lb

## 2013-11-16 DIAGNOSIS — I422 Other hypertrophic cardiomyopathy: Secondary | ICD-10-CM

## 2013-11-16 DIAGNOSIS — I10 Essential (primary) hypertension: Secondary | ICD-10-CM

## 2013-11-16 DIAGNOSIS — I4891 Unspecified atrial fibrillation: Secondary | ICD-10-CM

## 2013-11-16 NOTE — Progress Notes (Signed)
HPI [name] returns today for followup. She is a pleasant 68 yo woman with a h/o atrial fibrillation who has been well controlled on low dose flecainide in conjunction with a beta blocker. She has not had chest pain, sob, syncope or peripheral edema. Her sister has been diagnosed with HCM, and she wonders what the possibility that she has this diagnosis as well. Allergies  Allergen Reactions  . Carbamazepine   . Dilantin [Phenytoin Sodium Extended]   . Doxycycline Nausea And Vomiting    Made her sick for about 6 weeks & lost 30 lbs  . Hydrocodone-Acetaminophen   . Penicillins   . Phenytoin      Current Outpatient Prescriptions  Medication Sig Dispense Refill  . aspirin EC 81 MG tablet Take 1 tablet (81 mg total) by mouth daily.  90 tablet  3  . bacitracin ointment Apply 1 application topically as needed.      Marland Kitchen dexlansoprazole (DEXILANT) 60 MG capsule Take 60 mg by mouth 2 (two) times daily.        . flecainide (TAMBOCOR) 50 MG tablet TAKE 1 TABLET BY MOUTH EVERY 12 HOURS  60 tablet  0  . hyoscyamine (LEVSIN, ANASPAZ) 0.125 MG tablet Take 0.125 mg by mouth every 4 (four) hours as needed (Can take an extra tablet if needed).      . Lacosamide (VIMPAT) 150 MG TABS Take 1 tablet by mouth 2 (two) times daily.        . metoprolol succinate (TOPROL-XL) 25 MG 24 hr tablet Take 1 tablet (25 mg total) by mouth daily.  90 tablet  3   No current facility-administered medications for this visit.     Past Medical History  Diagnosis Date  . Angioma cavernosum   . HTN (hypertension)   . Atrial fibrillation   . Irritable bowel syndrome (IBS)   . Seizure disorder     ROS:   All systems reviewed and negative except as noted in the HPI.   Past Surgical History  Procedure Laterality Date  . Benign breast biopsy    . Salivary gland surgery    . Bartholins gland cyst resection       Family History  Problem Relation Age of Onset  . Stroke Mother   . Lung cancer Mother   .  Seizures Neg Hx      History   Social History  . Marital Status: Married    Spouse Name: N/A    Number of Children: 3  . Years of Education: N/A   Occupational History  . Not on file.   Social History Main Topics  . Smoking status: Never Smoker   . Smokeless tobacco: Not on file  . Alcohol Use: No  . Drug Use: No  . Sexual Activity: Not on file   Other Topics Concern  . Not on file   Social History Narrative  . No narrative on file     BP 119/70  Pulse 66  Ht 5\' 7"  (1.702 m)  Wt 153 lb (69.4 kg)  BMI 23.96 kg/m2  Physical Exam:  Well appearing 68 yo woman, NAD HEENT: Unremarkable Neck:  No JVD, no thyromegally Back:  No CVA tenderness Lungs:  Clear with no wheezes, rales, or rhonchi HEART:  Regular rate rhythm, no murmurs, no rubs, no clicks Abd:  soft, positive bowel sounds, no organomegally, no rebound, no guarding Ext:  2 plus pulses, no edema, no cyanosis, no clubbing Skin:  No rashes  no nodules Neuro:  CN II through XII intact, motor grossly intact  EKG - nsr with nonspecific IVCD  DEVICE  Normal device function.  See PaceArt for details.   Assess/Plan:

## 2013-11-16 NOTE — Patient Instructions (Signed)
Your physician wants you to follow-up in: 12 months with Dr Knox Saliva will receive a reminder letter in the mail two months in advance. If you don't receive a letter, please call our office to schedule the follow-up appointment.  Your physician has requested that you have an echocardiogram. Echocardiography is a painless test that uses sound waves to create images of your heart. It provides your doctor with information about the size and shape of your heart and how well your heart's chambers and valves are working. This procedure takes approximately one hour. There are no restrictions for this procedure.  Your physician has recommended you make the following change in your medication:  1) Stop Aspirin

## 2013-11-16 NOTE — Assessment & Plan Note (Signed)
She has never been diagnosed with HCM but her sister has this diagnosis. She has atrial fib. Will check a 2D echo.

## 2013-11-16 NOTE — Assessment & Plan Note (Signed)
Her blood pressure is well controlled. No change in medical therapy. 

## 2013-11-16 NOTE — Assessment & Plan Note (Signed)
The patient has maintained NSR very nicely. Will continue her current meds and maintain a low sodium diet. She reminds me that she has a h/o cavernous hemangioma. I have asked her to stop her ASA.

## 2013-11-18 ENCOUNTER — Ambulatory Visit (HOSPITAL_COMMUNITY): Payer: Medicare Other | Attending: Cardiology | Admitting: Radiology

## 2013-11-18 ENCOUNTER — Encounter: Payer: Self-pay | Admitting: Cardiology

## 2013-11-18 ENCOUNTER — Other Ambulatory Visit: Payer: Self-pay | Admitting: Orthopedic Surgery

## 2013-11-18 DIAGNOSIS — I10 Essential (primary) hypertension: Secondary | ICD-10-CM | POA: Insufficient documentation

## 2013-11-18 DIAGNOSIS — R52 Pain, unspecified: Secondary | ICD-10-CM

## 2013-11-18 DIAGNOSIS — I422 Other hypertrophic cardiomyopathy: Secondary | ICD-10-CM

## 2013-11-18 DIAGNOSIS — I059 Rheumatic mitral valve disease, unspecified: Secondary | ICD-10-CM | POA: Insufficient documentation

## 2013-11-18 DIAGNOSIS — I4891 Unspecified atrial fibrillation: Secondary | ICD-10-CM | POA: Insufficient documentation

## 2013-11-18 DIAGNOSIS — I421 Obstructive hypertrophic cardiomyopathy: Secondary | ICD-10-CM | POA: Insufficient documentation

## 2013-11-18 NOTE — Progress Notes (Signed)
Echocardiogram performed.  

## 2013-11-19 ENCOUNTER — Other Ambulatory Visit: Payer: Medicare Other

## 2013-11-22 ENCOUNTER — Ambulatory Visit
Admission: RE | Admit: 2013-11-22 | Discharge: 2013-11-22 | Disposition: A | Discharge status: Home / Self Care | Payer: Medicare Other | Source: Ambulatory Visit | Attending: Orthopedic Surgery | Admitting: Orthopedic Surgery

## 2013-11-22 DIAGNOSIS — R52 Pain, unspecified: Secondary | ICD-10-CM

## 2013-11-23 ENCOUNTER — Ambulatory Visit
Admission: RE | Admit: 2013-11-23 | Discharge: 2013-11-23 | Disposition: A | Discharge status: Home / Self Care | Payer: 59 | Source: Ambulatory Visit | Attending: Orthopedic Surgery | Admitting: Orthopedic Surgery

## 2013-11-23 ENCOUNTER — Other Ambulatory Visit: Payer: Self-pay | Admitting: Orthopedic Surgery

## 2013-11-23 ENCOUNTER — Inpatient Hospital Stay
Admission: RE | Admit: 2013-11-23 | Discharge: 2013-11-23 | Disposition: A | Discharge status: Home / Self Care | Payer: Self-pay | Source: Ambulatory Visit | Attending: Orthopedic Surgery | Admitting: Orthopedic Surgery

## 2013-11-23 DIAGNOSIS — R52 Pain, unspecified: Secondary | ICD-10-CM

## 2013-11-24 ENCOUNTER — Telehealth: Payer: Self-pay | Admitting: Internal Medicine

## 2013-11-24 NOTE — Telephone Encounter (Signed)
New message ° ° ° ° ° °Want echo results °

## 2013-11-25 MED ORDER — FLECAINIDE ACETATE 50 MG PO TABS: ORAL_TABLET | ORAL | Status: DC

## 2013-11-25 NOTE — Telephone Encounter (Signed)
Aware of results. 

## 2013-11-26 ENCOUNTER — Other Ambulatory Visit: Payer: Medicare Other

## 2014-01-11 ENCOUNTER — Encounter (HOSPITAL_COMMUNITY): Payer: Self-pay | Admitting: Emergency Medicine

## 2014-01-11 ENCOUNTER — Emergency Department (INDEPENDENT_AMBULATORY_CARE_PROVIDER_SITE_OTHER)
Admission: EM | Admit: 2014-01-11 | Discharge: 2014-01-11 | Disposition: A | Discharge status: Home / Self Care | Payer: Medicare Other | Source: Home / Self Care

## 2014-01-11 DIAGNOSIS — X503XXA Overexertion from repetitive movements, initial encounter: Secondary | ICD-10-CM

## 2014-01-11 DIAGNOSIS — X58XXXA Exposure to other specified factors, initial encounter: Secondary | ICD-10-CM

## 2014-01-11 DIAGNOSIS — M25571 Pain in right ankle and joints of right foot: Secondary | ICD-10-CM

## 2014-01-11 DIAGNOSIS — T148XXA Other injury of unspecified body region, initial encounter: Secondary | ICD-10-CM

## 2014-01-11 DIAGNOSIS — M25579 Pain in unspecified ankle and joints of unspecified foot: Secondary | ICD-10-CM

## 2014-01-11 NOTE — Discharge Instructions (Signed)
Ankle Pain Wear ankle support for next few days, elevate and continue ice Ankle pain is a common symptom. The bones, cartilage, tendons, and muscles of the ankle joint perform a lot of work each day. The ankle joint holds your body weight and allows you to move around. Ankle pain can occur on either side or back of 1 or both ankles. Ankle pain may be sharp and burning or dull and aching. There may be tenderness, stiffness, redness, or warmth around the ankle. The pain occurs more often when a person walks or puts pressure on the ankle. CAUSES  There are many reasons ankle pain can develop. It is important to work with your caregiver to identify the cause since many conditions can impact the bones, cartilage, muscles, and tendons. Causes for ankle pain include:  Injury, including a break (fracture), sprain, or strain often due to a fall, sports, or a high-impact activity.  Swelling (inflammation) of a tendon (tendonitis).  Achilles tendon rupture.  Ankle instability after repeated sprains and strains.  Poor foot alignment.  Pressure on a nerve (tarsal tunnel syndrome).  Arthritis in the ankle or the lining of the ankle.  Crystal formation in the ankle (gout or pseudogout). DIAGNOSIS  A diagnosis is based on your medical history, your symptoms, results of your physical exam, and results of diagnostic tests. Diagnostic tests may include X-ray exams or a computerized magnetic scan (magnetic resonance imaging, MRI). TREATMENT  Treatment will depend on the cause of your ankle pain and may include:  Keeping pressure off the ankle and limiting activities.  Using crutches or other walking support (a cane or brace).  Using rest, ice, compression, and elevation.  Participating in physical therapy or home exercises.  Wearing shoe inserts or special shoes.  Losing weight.  Taking medications to reduce pain or swelling or receiving an injection.  Undergoing surgery. HOME CARE INSTRUCTIONS     Only take over-the-counter or prescription medicines for pain, discomfort, or fever as directed by your caregiver.  Put ice on the injured area.  Put ice in a plastic bag.  Place a towel between your skin and the bag.  Leave the ice on for 15-20 minutes at a time, 03-04 times a day.  Keep your leg raised (elevated) when possible to lessen swelling.  Avoid activities that cause ankle pain.  Follow specific exercises as directed by your caregiver.  Record how often you have ankle pain, the location of the pain, and what it feels like. This information may be helpful to you and your caregiver.  Ask your caregiver about returning to work or sports and whether you should drive.  Follow up with your caregiver for further examination, therapy, or testing as directed. SEEK MEDICAL CARE IF:   Pain or swelling continues or worsens beyond 1 week.  You have an oral temperature above 102 F (38.9 C).  You are feeling unwell or have chills.  You are having an increasingly difficult time with walking.  You have loss of sensation or other new symptoms.  You have questions or concerns. MAKE SURE YOU:   Understand these instructions.  Will watch your condition.  Will get help right away if you are not doing well or get worse. Document Released: 01/09/2010 Document Revised: 10/14/2011 Document Reviewed: 01/09/2010 [facility] Patient Information 2014 [place].  Cryotherapy Cryotherapy means treatment with cold. Ice or gel packs can be used to reduce both pain and swelling. Ice is the most helpful within the first 24 to 48  hours after an injury or flareup from overusing a muscle or joint. Sprains, strains, spasms, burning pain, shooting pain, and aches can all be eased with ice. Ice can also be used when recovering from surgery. Ice is effective, has very few side effects, and is safe for most people to use. PRECAUTIONS  Ice is not a safe treatment option for people  with:  Raynaud's phenomenon. This is a condition affecting small blood vessels in the extremities. Exposure to cold may cause your problems to return.  Cold hypersensitivity. There are many forms of cold hypersensitivity, including:  Cold urticaria. Red, itchy hives appear on the skin when the tissues begin to warm after being iced.  Cold erythema. This is a red, itchy rash caused by exposure to cold.  Cold hemoglobinuria. Red blood cells break down when the tissues begin to warm after being iced. The hemoglobin that carry oxygen are passed into the urine because they cannot combine with blood proteins fast enough.  Numbness or altered sensitivity in the area being iced. If you have any of the following conditions, do not use ice until you have discussed cryotherapy with your caregiver:  Heart conditions, such as arrhythmia, angina, or chronic heart disease.  High blood pressure.  Healing wounds or open skin in the area being iced.  Current infections.  Rheumatoid arthritis.  Poor circulation.  Diabetes. Ice slows the blood flow in the region it is applied. This is beneficial when trying to stop inflamed tissues from spreading irritating chemicals to surrounding tissues. However, if you expose your skin to cold temperatures for too long or without the proper protection, you can damage your skin or nerves. Watch for signs of skin damage due to cold. HOME CARE INSTRUCTIONS Follow these tips to use ice and cold packs safely.  Place a dry or damp towel between the ice and skin. A damp towel will cool the skin more quickly, so you may need to shorten the time that the ice is used.  For a more rapid response, add gentle compression to the ice.  Ice for no more than 10 to 20 minutes at a time. The bonier the area you are icing, the less time it will take to get the benefits of ice.  Check your skin after 5 minutes to make sure there are no signs of a poor response to cold or skin  damage.  Rest 20 minutes or more in between uses.  Once your skin is numb, you can end your treatment. You can test numbness by very lightly touching your skin. The touch should be so light that you do not see the skin dimple from the pressure of your fingertip. When using ice, most people will feel these normal sensations in this order: cold, burning, aching, and numbness.  Do not use ice on someone who cannot communicate their responses to pain, such as small children or people with dementia. HOW TO MAKE AN ICE PACK Ice packs are the most common way to use ice therapy. Other methods include ice massage, ice baths, and cryo-sprays. Muscle creams that cause a cold, tingly feeling do not offer the same benefits that ice offers and should not be used as a substitute unless recommended by your caregiver. To make an ice pack, do one of the following:  Place crushed ice or a bag of frozen vegetables in a sealable plastic bag. Squeeze out the excess air. Place this bag inside another plastic bag. Slide the bag into a pillowcase or  place a damp towel between your skin and the bag.  Mix 3 parts water with 1 part rubbing alcohol. Freeze the mixture in a sealable plastic bag. When you remove the mixture from the freezer, it will be slushy. Squeeze out the excess air. Place this bag inside another plastic bag. Slide the bag into a pillowcase or place a damp towel between your skin and the bag. SEEK MEDICAL CARE IF:  You develop white spots on your skin. This may give the skin a blotchy (mottled) appearance.  Your skin turns blue or pale.  Your skin becomes waxy or hard.  Your swelling gets worse. MAKE SURE YOU:   Understand these instructions.  Will watch your condition.  Will get help right away if you are not doing well or get worse. Document Released: 03/18/2011 Document Revised: 10/14/2011 Document Reviewed: 03/18/2011 [facility] Patient Information 2014 [place], [place].

## 2014-01-11 NOTE — ED Provider Notes (Signed)
CSN: [id number]     Arrival date & time 01/11/14  1953 History   First MD Initiated Contact with Patient 01/11/14 2039     Chief Complaint  Patient presents with  . Ankle Pain   (Consider location/radiation/quality/duration/timing/severity/associated sxs/prior Treatment) HPI Comments: 68 year old female complaining of pain and swelling to the right ankle. She recently returned from a trip to [place]. During the trip she walked several miles much of that of a cobblestone roads. During that time she experience swelling and some redness to the right ankle. She denies injury such as inversion, eversion injury to the ankle. Denies sprain knee and ankle. There was an area of erythema to the lateral aspect that has since diminished to a very faint small area of light erythema.   Past Medical History  Diagnosis Date  . Angioma cavernosum   . HTN (hypertension)   . Atrial fibrillation   . Irritable bowel syndrome (IBS)   . Seizure disorder    Past Surgical History  Procedure Laterality Date  . Benign breast biopsy    . Salivary gland surgery    . Bartholins gland cyst resection    . Abdominal hysterectomy    . Cholecystectomy     Family History  Problem Relation Age of Onset  . Stroke Mother   . Lung cancer Mother   . Seizures Neg Hx    History  Substance Use Topics  . Smoking status: Never Smoker   . Smokeless tobacco: Not on file  . Alcohol Use: No   OB History   Grav Para Term Preterm Abortions TAB SAB Ect Mult Living                 Review of Systems  Constitutional: Negative for fever, chills and activity change.  HENT: Negative.   Respiratory: Negative.   Cardiovascular: Negative.   Musculoskeletal: Negative for back pain, myalgias, neck pain and neck stiffness.       As per HPI  Skin: Negative for pallor and rash.  Neurological: Negative.     Allergies  Carbamazepine; Dilantin; Doxycycline; Hydrocodone-acetaminophen; Penicillins; and Phenytoin  Home Medications    Prior to Admission medications   Medication Sig Start Date End Date Taking? Authorizing Provider  dexlansoprazole (DEXILANT) 60 MG capsule Take 60 mg by mouth 2 (two) times daily.     Yes Historical Provider, MD  flecainide (TAMBOCOR) 50 MG tablet TAKE 1 TABLET BY MOUTH EVERY 12 HOURS 11/25/13  Yes Evans Lance, MD  hyoscyamine (LEVSIN, ANASPAZ) 0.125 MG tablet Take 0.125 mg by mouth every 4 (four) hours as needed (Can take an extra tablet if needed).   Yes Historical Provider, MD  Lacosamide (VIMPAT) 150 MG TABS Take 1 tablet by mouth 2 (two) times daily.     Yes Historical Provider, MD  metoprolol succinate (TOPROL-XL) 25 MG 24 hr tablet Take 1 tablet (25 mg total) by mouth daily. 11/02/12  Yes Evans Lance, MD  bacitracin ointment Apply 1 application topically as needed. 08/31/13   Elyn Peers, MD   BP 135/67  Pulse 66  Temp(Src) 98 F (36.7 C) (Oral)  Resp 16  SpO2 98% Physical Exam  Nursing note and vitals reviewed. Constitutional: She is oriented to person, place, and time. She appears well-developed and well-nourished. No distress.  HENT:  Head: Normocephalic and atraumatic.  Eyes: EOM are normal.  Neck: Normal range of motion. Neck supple.  Pulmonary/Chest: Effort normal. No respiratory distress.  Musculoskeletal: Normal range of motion.  Mild puffiness  primarily to the anterolateral aspect of the ankle. No bony tenderness. No deformity. There are 2 small areas of petechia type lesions just above the midline. Ankle with full range of motion. Normal strength. No swelling, deformity or tenderness to the foot. Distal neurovascular motor sensory is intact. Pedal pulses 2+.  Neurological: She is alert and oriented to person, place, and time. No cranial nerve deficit.  Skin: Skin is warm and dry.    ED Course  Procedures (including critical care time) Labs Review Labs Reviewed - No data to display  Imaging Review No results found.   MDM   1. Overuse injury   2. Right  ankle pain     OVeruse injury from excessive walking on cobblestone streets. Improving sx's. ASO RICE.    Janne Napoleon, NP 01/11/14 2113

## 2014-01-11 NOTE — ED Provider Notes (Signed)
Medical screening examination/treatment/procedure(s) were performed by non-physician practitioner and as supervising physician I was immediately available for consultation/collaboration.  Philipp Deputy, M.D.  Harden Mo, MD 01/11/14 2228

## 2014-01-11 NOTE — ED Notes (Addendum)
Just got back from [place] and did a lot of walking.  C/o pain and swelling to R ankle x 4-5 days.  Had a large red spot on lateral R ankle behind malleolus, that has faded over the past few days.  Also has small red rash to distal lower leg medially.  L ankle slightly swollen.

## 2014-01-14 ENCOUNTER — Telehealth: Payer: Self-pay | Admitting: Internal Medicine

## 2014-01-14 NOTE — Telephone Encounter (Signed)
New message    Patient has a questions regarding her medical history of having HTN.  Patient states she has never been told that before . Leaving to go out of town for work has some concerns.

## 2014-01-14 NOTE — Telephone Encounter (Signed)
Left a message for patient that I can only see what is in the chart and it looks as though the information was put in the chart from her PCP's office notes. She does take Metoprolol which can be used for afib(which she has) and HTN.  Let her know she can call me back with it I can help.  Just not sure I know where this diagnosis came from  She should also call her PCP to inquire

## 2014-01-24 ENCOUNTER — Other Ambulatory Visit: Payer: Self-pay

## 2014-01-24 MED ORDER — METOPROLOL SUCCINATE ER 25 MG PO TB24: 25.0000 mg | ORAL_TABLET | Freq: Every day | ORAL | Status: DC

## 2014-03-30 ENCOUNTER — Other Ambulatory Visit: Payer: Self-pay

## 2014-03-30 DIAGNOSIS — Z1231 Encounter for screening mammogram for malignant neoplasm of breast: Secondary | ICD-10-CM

## 2014-04-27 ENCOUNTER — Ambulatory Visit: Payer: Medicare Other

## 2014-05-05 ENCOUNTER — Ambulatory Visit
Admission: RE | Admit: 2014-05-05 | Discharge: 2014-05-05 | Disposition: A | Discharge status: Home / Self Care | Payer: Medicare Other | Source: Ambulatory Visit

## 2014-05-05 DIAGNOSIS — Z1231 Encounter for screening mammogram for malignant neoplasm of breast: Secondary | ICD-10-CM

## 2014-06-24 ENCOUNTER — Telehealth: Payer: Self-pay | Admitting: Internal Medicine

## 2014-06-24 NOTE — Telephone Encounter (Signed)
New problem   Pt is taking Robinul for stomach problems, she want to know if this is ok since she has Afib

## 2014-06-24 NOTE — Telephone Encounter (Signed)
Calling stating her physician has advised her to take Robinul for her stomach problems ie: irritable bowel.  States she is concerned that this could effect her Afib. Spoke with Ronnald Collum who states should not be a problem with Afib but one side effect is Tachycardia so just be advised.  Advised would start the medication and if she starts having the fast heart rate to stop the medication. She verbalizes understanding.

## 2014-07-25 ENCOUNTER — Other Ambulatory Visit: Payer: Self-pay | Admitting: Internal Medicine

## 2014-10-12 ENCOUNTER — Other Ambulatory Visit: Payer: Self-pay | Admitting: Orthopedic Surgery

## 2014-10-12 DIAGNOSIS — M542 Cervicalgia: Secondary | ICD-10-CM

## 2014-10-14 ENCOUNTER — Other Ambulatory Visit: Payer: Self-pay

## 2014-10-17 ENCOUNTER — Ambulatory Visit
Admission: RE | Admit: 2014-10-17 | Discharge: 2014-10-17 | Disposition: A | Discharge status: Home / Self Care | Payer: Medicare Other | Source: Ambulatory Visit | Attending: Orthopedic Surgery | Admitting: Orthopedic Surgery

## 2014-10-17 DIAGNOSIS — M542 Cervicalgia: Secondary | ICD-10-CM

## 2014-11-08 DIAGNOSIS — G40109 Localization-related (focal) (partial) symptomatic epilepsy and epileptic syndromes with simple partial seizures, not intractable, without status epilepticus: Secondary | ICD-10-CM | POA: Insufficient documentation

## 2014-11-22 ENCOUNTER — Ambulatory Visit (INDEPENDENT_AMBULATORY_CARE_PROVIDER_SITE_OTHER): Payer: Medicare Other | Admitting: Internal Medicine

## 2014-11-22 ENCOUNTER — Encounter: Payer: Self-pay | Admitting: Internal Medicine

## 2014-11-22 ENCOUNTER — Other Ambulatory Visit: Payer: Self-pay

## 2014-11-22 VITALS — BP 110/62 | HR 69 | Ht 66.5 in | Wt 155.8 lb

## 2014-11-22 DIAGNOSIS — D1800 Hemangioma unspecified site: Secondary | ICD-10-CM | POA: Diagnosis not present

## 2014-11-22 DIAGNOSIS — I10 Essential (primary) hypertension: Secondary | ICD-10-CM

## 2014-11-22 DIAGNOSIS — I480 Paroxysmal atrial fibrillation: Secondary | ICD-10-CM | POA: Diagnosis not present

## 2014-11-22 DIAGNOSIS — I4891 Unspecified atrial fibrillation: Secondary | ICD-10-CM

## 2014-11-22 MED ORDER — METOPROLOL SUCCINATE ER 25 MG PO TB24: 25.0000 mg | ORAL_TABLET | Freq: Every day | ORAL | Status: DC

## 2014-11-22 MED ORDER — FLECAINIDE ACETATE 50 MG PO TABS: 50.0000 mg | ORAL_TABLET | Freq: Two times a day (BID) | ORAL | Status: DC

## 2014-11-22 NOTE — Progress Notes (Signed)
HPI Mrs. Mcatee returns today for followup. She is a pleasant 69 yo woman with a h/o atrial fibrillation who has been well controlled on low dose flecainide in conjunction with a beta blocker. She has not had chest pain, sob, syncope or peripheral edema. Her sister has been diagnosed with HCM, but her echo does not support this diagnosis.  Allergies  Allergen Reactions  . Carbamazepine Hives  . Dilantin [Phenytoin Sodium Extended] Hives    Blisters  . Doxycycline Nausea And Vomiting    Made her sick for about 6 weeks & lost 30 lbs  . Hydrocodone-Acetaminophen Nausea And Vomiting  . Phenytoin Hives  . Penicillins Itching     Current Outpatient Prescriptions  Medication Sig Dispense Refill  . dexlansoprazole (DEXILANT) 60 MG capsule Take 60 mg by mouth 2 (two) times daily.      . flecainide (TAMBOCOR) 50 MG tablet TAKE 1 TABLET BY MOUTH TWICE DAILY 60 tablet 3  . hyoscyamine (LEVSIN, ANASPAZ) 0.125 MG tablet Take 0.125 mg by mouth every 4 (four) hours as needed for cramping (Can take an extra tablet if needed).     . Lacosamide (VIMPAT) 150 MG TABS Take 1 tablet by mouth 2 (two) times daily.      . meloxicam (MOBIC) 15 MG tablet as directed. Pt has not started yet (11/22/14)  0  . metoprolol succinate (TOPROL-XL) 25 MG 24 hr tablet Take 1 tablet (25 mg total) by mouth daily. 90 tablet 3   No current facility-administered medications for this visit.     Past Medical History  Diagnosis Date  . Angioma cavernosum   . HTN (hypertension)   . Atrial fibrillation   . Irritable bowel syndrome (IBS)   . Seizure disorder     ROS:   All systems reviewed and negative except as noted in the HPI.   Past Surgical History  Procedure Laterality Date  . Benign breast biopsy    . Salivary gland surgery    . Bartholins gland cyst resection    . Abdominal hysterectomy    . Cholecystectomy       Family History  Problem Relation Age of Onset  . Stroke Mother   . Lung cancer  Mother   . Seizures Neg Hx      History   Social History  . Marital Status: Married    Spouse Name: N/A  . Number of Children: 3  . Years of Education: N/A   Occupational History  . Not on file.   Social History Main Topics  . Smoking status: Never Smoker   . Smokeless tobacco: Not on file  . Alcohol Use: No  . Drug Use: No  . Sexual Activity: Not on file   Other Topics Concern  . Not on file   Social History Narrative     BP 110/62 mmHg  Pulse 69  Ht 5' 6.5" (1.689 m)  Wt 155 lb 12.8 oz (70.67 kg)  BMI 24.77 kg/m2  Physical Exam:  Well appearing 69 yo woman, NAD HEENT: Unremarkable Neck:  No JVD, no thyromegally Back:  No CVA tenderness Lungs:  Clear with no wheezes, rales, or rhonchi HEART:  Regular rate rhythm, no murmurs, no rubs, no clicks Abd:  soft, positive bowel sounds, no organomegally, no rebound, no guarding Ext:  2 plus pulses, no edema, no cyanosis, no clubbing Skin:  No rashes no nodules Neuro:  CN II through XII intact, motor grossly intact  EKG - nsr with  nonspecific IVCD   Assess/Plan:

## 2014-11-22 NOTE — Assessment & Plan Note (Signed)
She is maintaining NSR very nicely. She is not a candidate for systemic anti-coagulation due to her cavernous hemangioma.

## 2014-11-22 NOTE — Assessment & Plan Note (Signed)
Her blood pressure is well controlled. She will continue her current meds.  

## 2014-11-22 NOTE — Patient Instructions (Signed)
Your physician recommends that you continue on your current medications as directed. Please refer to the Current Medication list given to you today.  Your physician wants you to follow-up in: 1 year with Dr. Taylor.  You will receive a reminder letter in the mail two months in advance. If you don't receive a letter, please call our office to schedule the follow-up appointment.  

## 2014-11-22 NOTE — Assessment & Plan Note (Signed)
She is stable. No evidence of bleeding

## 2015-05-15 ENCOUNTER — Other Ambulatory Visit: Payer: Self-pay

## 2015-05-15 DIAGNOSIS — Z1231 Encounter for screening mammogram for malignant neoplasm of breast: Secondary | ICD-10-CM

## 2015-06-13 ENCOUNTER — Ambulatory Visit: Payer: Medicare Other

## 2015-07-04 ENCOUNTER — Ambulatory Visit: Payer: Medicare Other

## 2015-08-10 ENCOUNTER — Ambulatory Visit
Admission: RE | Admit: 2015-08-10 | Discharge: 2015-08-10 | Disposition: A | Discharge status: Home / Self Care | Payer: Medicare Other | Source: Ambulatory Visit

## 2015-08-10 DIAGNOSIS — Z1231 Encounter for screening mammogram for malignant neoplasm of breast: Secondary | ICD-10-CM

## 2015-11-16 ENCOUNTER — Ambulatory Visit: Payer: Medicare Other | Admitting: Internal Medicine

## 2015-11-24 ENCOUNTER — Ambulatory Visit: Payer: Medicare Other | Admitting: Internal Medicine

## 2015-12-03 ENCOUNTER — Other Ambulatory Visit: Payer: Self-pay | Admitting: Internal Medicine

## 2015-12-12 ENCOUNTER — Ambulatory Visit: Payer: Medicare Other | Admitting: Internal Medicine

## 2015-12-15 ENCOUNTER — Ambulatory Visit: Payer: Medicare Other | Admitting: Internal Medicine

## 2016-01-30 ENCOUNTER — Other Ambulatory Visit: Payer: Self-pay | Admitting: *Deleted

## 2016-01-30 DIAGNOSIS — I10 Essential (primary) hypertension: Secondary | ICD-10-CM

## 2016-01-30 MED ORDER — METOPROLOL SUCCINATE ER 25 MG PO TB24: 25.0000 mg | ORAL_TABLET | Freq: Every day | ORAL | Status: DC

## 2016-02-29 ENCOUNTER — Encounter: Payer: Self-pay | Admitting: Internal Medicine

## 2016-02-29 ENCOUNTER — Ambulatory Visit (INDEPENDENT_AMBULATORY_CARE_PROVIDER_SITE_OTHER): Payer: Medicare Other | Admitting: Internal Medicine

## 2016-02-29 VITALS — BP 120/82 | HR 89 | Ht 66.0 in | Wt 155.2 lb

## 2016-02-29 DIAGNOSIS — I10 Essential (primary) hypertension: Secondary | ICD-10-CM | POA: Diagnosis not present

## 2016-02-29 DIAGNOSIS — I480 Paroxysmal atrial fibrillation: Secondary | ICD-10-CM | POA: Diagnosis not present

## 2016-02-29 MED ORDER — METOPROLOL SUCCINATE ER 25 MG PO TB24: 25.0000 mg | ORAL_TABLET | Freq: Every day | ORAL | 3 refills | Status: DC

## 2016-02-29 MED ORDER — FLECAINIDE ACETATE 50 MG PO TABS: ORAL_TABLET | ORAL | 3 refills | Status: DC

## 2016-02-29 NOTE — Progress Notes (Signed)
HPI Gwendolyn Alvarado returns today for followup. She is a pleasant 70 yo woman with a h/o atrial fibrillation who has been well controlled on low dose flecainide in conjunction with a beta blocker. She has not had chest pain, sob, syncope or peripheral edema. Her sister has been diagnosed with HCM, but her echo does not support this diagnosis.  Allergies  Allergen Reactions  . Carbamazepine Hives and Other (See Comments)    Blisters similar to [employer] Syndrome  . Dilantin [Phenytoin Sodium Extended] Hives and Other (See Comments)    Blisters similar to Remo Lipps Johnston's Syndrome Blisters  . Doxycycline Nausea And Vomiting    Made her sick for about 6 weeks & lost 30 lbs  . Hydrocodone-Acetaminophen Nausea And Vomiting  . Phenytoin Hives  . Penicillins Itching and Rash     Current Outpatient Prescriptions  Medication Sig Dispense Refill  . flecainide (TAMBOCOR) 50 MG tablet TAKE 1 TABLET(50 MG) BY MOUTH TWICE DAILY 180 tablet 0  . hyoscyamine (LEVSIN, ANASPAZ) 0.125 MG tablet Take 0.125 mg by mouth every 4 (four) hours as needed for cramping (Can take an extra tablet if needed).     . Lacosamide (VIMPAT) 150 MG TABS Take 1 tablet by mouth 2 (two) times daily.      . metoprolol succinate (TOPROL-XL) 25 MG 24 hr tablet Take 1 tablet (25 mg total) by mouth daily. 90 tablet 0   No current facility-administered medications for this visit.      Past Medical History:  Diagnosis Date  . Angioma cavernosum   . Atrial fibrillation ([place])   . HTN (hypertension)   . Irritable bowel syndrome (IBS)   . Seizure disorder ([place])     ROS:   All systems reviewed and negative except as noted in the HPI.   Past Surgical History:  Procedure Laterality Date  . ABDOMINAL HYSTERECTOMY    . Bartholins gland cyst resection    . benign breast biopsy    . CHOLECYSTECTOMY    . SALIVARY GLAND SURGERY       Family History  Problem Relation Age of Onset  . Stroke Mother   . Lung cancer  Mother   . Seizures Neg Hx      Social History   Social History  . Marital status: Married    Spouse name: N/A  . Number of children: 3  . Years of education: N/A   Occupational History  . Not on file.   Social History Main Topics  . Smoking status: Never Smoker  . Smokeless tobacco: Not on file  . Alcohol use No  . Drug use: No  . Sexual activity: Not on file   Other Topics Concern  . Not on file   Social History Narrative  . No narrative on file     BP 120/82   Pulse 89   Ht 5\' 6"  (1.676 m)   Wt 155 lb 3.2 oz (70.4 kg)   BMI 25.05 kg/m   Physical Exam:  Well appearing 70 yo woman, NAD HEENT: Unremarkable Neck:  No JVD, no thyromegally Back:  No CVA tenderness Lungs:  Clear with no wheezes, rales, or rhonchi HEART:  Regular rate rhythm, no murmurs, no rubs, no clicks Abd:  soft, positive bowel sounds, no organomegally, no rebound, no guarding Ext:  2 plus pulses, no edema, no cyanosis, no clubbing Skin:  No rashes no nodules Neuro:  CN II through XII intact, motor grossly intact  EKG -  nsr with nonspecific IVCD   Assess/Plan: 1. PAF - we discussed the treatment options in detail. She is not a good long term candidate for systemic anti-coagulation. We discussed the Watchman device. For now, she wishes to hold off on this. 2. Cavernous hemangioma - she is not anticoagulated. We discussed this in detail. Will follow. I doubt she is a good anti-coagulation candidate.

## 2016-02-29 NOTE — Patient Instructions (Signed)

## 2016-06-23 ENCOUNTER — Emergency Department (HOSPITAL_COMMUNITY): Payer: Medicare Other

## 2016-06-23 ENCOUNTER — Observation Stay (HOSPITAL_COMMUNITY)
Admission: EM | Admit: 2016-06-23 | Discharge: 2016-06-25 | Disposition: A | Discharge status: Home / Self Care | Payer: Medicare Other | Attending: Internal Medicine | Admitting: Internal Medicine

## 2016-06-23 ENCOUNTER — Encounter (HOSPITAL_COMMUNITY): Payer: Self-pay | Admitting: *Deleted

## 2016-06-23 DIAGNOSIS — Z885 Allergy status to narcotic agent status: Secondary | ICD-10-CM | POA: Insufficient documentation

## 2016-06-23 DIAGNOSIS — Z90710 Acquired absence of both cervix and uterus: Secondary | ICD-10-CM | POA: Insufficient documentation

## 2016-06-23 DIAGNOSIS — D1771 Benign lipomatous neoplasm of kidney: Secondary | ICD-10-CM | POA: Insufficient documentation

## 2016-06-23 DIAGNOSIS — I10 Essential (primary) hypertension: Secondary | ICD-10-CM | POA: Diagnosis present

## 2016-06-23 DIAGNOSIS — I959 Hypotension, unspecified: Principal | ICD-10-CM | POA: Diagnosis present

## 2016-06-23 DIAGNOSIS — Z801 Family history of malignant neoplasm of trachea, bronchus and lung: Secondary | ICD-10-CM | POA: Insufficient documentation

## 2016-06-23 DIAGNOSIS — R51 Headache: Secondary | ICD-10-CM | POA: Diagnosis not present

## 2016-06-23 DIAGNOSIS — I480 Paroxysmal atrial fibrillation: Secondary | ICD-10-CM | POA: Diagnosis not present

## 2016-06-23 DIAGNOSIS — Z888 Allergy status to other drugs, medicaments and biological substances status: Secondary | ICD-10-CM | POA: Diagnosis not present

## 2016-06-23 DIAGNOSIS — D1809 Hemangioma of other sites: Secondary | ICD-10-CM | POA: Diagnosis not present

## 2016-06-23 DIAGNOSIS — K5732 Diverticulitis of large intestine without perforation or abscess without bleeding: Secondary | ICD-10-CM | POA: Diagnosis not present

## 2016-06-23 DIAGNOSIS — Z880 Allergy status to penicillin: Secondary | ICD-10-CM | POA: Insufficient documentation

## 2016-06-23 DIAGNOSIS — Z881 Allergy status to other antibiotic agents status: Secondary | ICD-10-CM | POA: Diagnosis not present

## 2016-06-23 DIAGNOSIS — Z823 Family history of stroke: Secondary | ICD-10-CM | POA: Insufficient documentation

## 2016-06-23 DIAGNOSIS — N200 Calculus of kidney: Secondary | ICD-10-CM | POA: Insufficient documentation

## 2016-06-23 DIAGNOSIS — G40909 Epilepsy, unspecified, not intractable, without status epilepticus: Secondary | ICD-10-CM | POA: Diagnosis not present

## 2016-06-23 DIAGNOSIS — N281 Cyst of kidney, acquired: Secondary | ICD-10-CM | POA: Diagnosis not present

## 2016-06-23 DIAGNOSIS — I4891 Unspecified atrial fibrillation: Secondary | ICD-10-CM | POA: Diagnosis present

## 2016-06-23 DIAGNOSIS — A419 Sepsis, unspecified organism: Secondary | ICD-10-CM

## 2016-06-23 DIAGNOSIS — K589 Irritable bowel syndrome without diarrhea: Secondary | ICD-10-CM | POA: Insufficient documentation

## 2016-06-23 HISTORY — DX: Hypotension, unspecified: I95.9

## 2016-06-23 LAB — URINALYSIS, ROUTINE W REFLEX MICROSCOPIC
Bilirubin Urine: NEGATIVE
Glucose, UA: NEGATIVE mg/dL
Ketones, ur: 40 mg/dL — AB
Leukocytes, UA: NEGATIVE
Nitrite: NEGATIVE
Protein, ur: NEGATIVE mg/dL
Specific Gravity, Urine: 1.017 (ref 1.005–1.030)
pH: 6 (ref 5.0–8.0)

## 2016-06-23 LAB — COMPREHENSIVE METABOLIC PANEL
ALT: 40 U/L (ref 14–54)
AST: 38 U/L (ref 15–41)
Albumin: 3.8 g/dL (ref 3.5–5.0)
Alkaline Phosphatase: 82 U/L (ref 38–126)
Anion gap: 10 (ref 5–15)
BUN: 10 mg/dL (ref 6–20)
CO2: 23 mmol/L (ref 22–32)
Calcium: 9 mg/dL (ref 8.9–10.3)
Chloride: 104 mmol/L (ref 101–111)
Creatinine, Ser: 0.74 mg/dL (ref 0.44–1.00)
GFR calc Af Amer: >60 mL/min (ref >60)
GFR calc non Af Amer: >60 mL/min (ref >60)
Glucose, Bld: 109 mg/dL — ABNORMAL HIGH (ref 65–99)
Potassium: 4.2 mmol/L (ref 3.5–5.1)
Sodium: 137 mmol/L (ref 135–145)
Total Bilirubin: 1.7 mg/dL — ABNORMAL HIGH (ref 0.3–1.2)
Total Protein: 6.8 g/dL (ref 6.5–8.1)

## 2016-06-23 LAB — CBC
HCT: 40.9 % (ref 36.0–46.0)
Hemoglobin: 14 g/dL (ref 12.0–15.0)
MCH: 31.5 pg (ref 26.0–34.0)
MCHC: 34.2 g/dL (ref 30.0–36.0)
MCV: 91.9 fL (ref 78.0–100.0)
Platelets: 232 10*3/uL (ref 150–400)
RBC: 4.45 MIL/uL (ref 3.87–5.11)
RDW: 13.4 % (ref 11.5–15.5)
WBC: 13.1 10*3/uL — ABNORMAL HIGH (ref 4.0–10.5)

## 2016-06-23 LAB — LIPASE, BLOOD: Lipase: 31 U/L (ref 11–51)

## 2016-06-23 LAB — I-STAT CG4 LACTIC ACID, ED
Lactic Acid, Venous: 0.69 mmol/L (ref 0.5–1.9)
Lactic Acid, Venous: 0.49 mmol/L — ABNORMAL LOW (ref 0.5–1.9)

## 2016-06-23 LAB — URINE MICROSCOPIC-ADD ON

## 2016-06-23 MED ORDER — SODIUM CHLORIDE 0.9 % IV SOLN
INTRAVENOUS | Status: DC
  Administered 2016-06-23 – 2016-06-25 (×4): via INTRAVENOUS

## 2016-06-23 MED ORDER — ONDANSETRON HCL 4 MG/2ML IJ SOLN: 4.0000 mg | Freq: Four times a day (QID) | INTRAMUSCULAR | Status: DC | PRN

## 2016-06-23 MED ORDER — SODIUM CHLORIDE 0.9 % IV BOLUS (SEPSIS)
1000.0000 mL | Freq: Once | INTRAVENOUS | Status: AC
  Administered 2016-06-23: 1000 mL via INTRAVENOUS

## 2016-06-23 MED ORDER — METRONIDAZOLE IN NACL 5-0.79 MG/ML-% IV SOLN
500.0000 mg | Freq: Once | INTRAVENOUS | Status: AC
  Administered 2016-06-23: 500 mg via INTRAVENOUS
  Filled 2016-06-23: qty 100

## 2016-06-23 MED ORDER — IOPAMIDOL (ISOVUE-300) INJECTION 61%
INTRAVENOUS | Status: AC
  Administered 2016-06-23: 100 mL
  Filled 2016-06-23: qty 100

## 2016-06-23 MED ORDER — METRONIDAZOLE IN NACL 5-0.79 MG/ML-% IV SOLN
500.0000 mg | Freq: Three times a day (TID) | INTRAVENOUS | Status: DC
  Administered 2016-06-23 – 2016-06-24 (×3): 500 mg via INTRAVENOUS
  Filled 2016-06-23 (×3): qty 100

## 2016-06-23 MED ORDER — LACOSAMIDE 50 MG PO TABS: 150.0000 mg | ORAL_TABLET | Freq: Two times a day (BID) | ORAL | Status: DC

## 2016-06-23 MED ORDER — ACETAMINOPHEN 325 MG PO TABS
650.0000 mg | ORAL_TABLET | Freq: Four times a day (QID) | ORAL | Status: DC | PRN
  Administered 2016-06-23 – 2016-06-25 (×2): 650 mg via ORAL
  Filled 2016-06-23 (×2): qty 2

## 2016-06-23 MED ORDER — ACETAMINOPHEN 650 MG RE SUPP: 650.0000 mg | Freq: Four times a day (QID) | RECTAL | Status: DC | PRN

## 2016-06-23 MED ORDER — CIPROFLOXACIN IN D5W 400 MG/200ML IV SOLN
400.0000 mg | Freq: Two times a day (BID) | INTRAVENOUS | Status: DC
  Administered 2016-06-23 – 2016-06-24 (×2): 400 mg via INTRAVENOUS
  Filled 2016-06-23 (×2): qty 200

## 2016-06-23 MED ORDER — CIPROFLOXACIN IN D5W 400 MG/200ML IV SOLN
400.0000 mg | Freq: Once | INTRAVENOUS | Status: AC
  Administered 2016-06-23: 400 mg via INTRAVENOUS
  Filled 2016-06-23: qty 200

## 2016-06-23 MED ORDER — METRONIDAZOLE 500 MG PO TABS: 500.0000 mg | ORAL_TABLET | Freq: Two times a day (BID) | ORAL | 0 refills | Status: DC

## 2016-06-23 MED ORDER — LACOSAMIDE 50 MG PO TABS
150.0000 mg | ORAL_TABLET | Freq: Two times a day (BID) | ORAL | Status: DC
  Administered 2016-06-23 – 2016-06-25 (×4): 150 mg via ORAL
  Filled 2016-06-23 (×5): qty 3

## 2016-06-23 MED ORDER — DEXTROSE 5 % IV SOLN: 1.0000 g | Freq: Once | INTRAVENOUS | Status: DC

## 2016-06-23 MED ORDER — ONDANSETRON HCL 4 MG PO TABS: 4.0000 mg | ORAL_TABLET | Freq: Four times a day (QID) | ORAL | 0 refills | Status: DC

## 2016-06-23 MED ORDER — CIPROFLOXACIN HCL 500 MG PO TABS: 500.0000 mg | ORAL_TABLET | Freq: Two times a day (BID) | ORAL | 0 refills | Status: DC

## 2016-06-23 MED ORDER — ONDANSETRON HCL 4 MG PO TABS: 4.0000 mg | ORAL_TABLET | Freq: Four times a day (QID) | ORAL | Status: DC | PRN

## 2016-06-23 MED ORDER — FLECAINIDE ACETATE 50 MG PO TABS
50.0000 mg | ORAL_TABLET | Freq: Two times a day (BID) | ORAL | Status: DC
  Administered 2016-06-23 – 2016-06-25 (×4): 50 mg via ORAL
  Filled 2016-06-23 (×4): qty 1

## 2016-06-23 NOTE — ED Notes (Addendum)
Pt.s BP was 67/34 and 66/31. Reported to Dr. Laverta Baltimore and Rolene Arbour, Rn reported to Dr. Sheran Lawless.  This RN checked her BP via manual and it is 98/52

## 2016-06-23 NOTE — ED Notes (Signed)
Pt.s BP has been in the 80/50 via monitor.  Pt. Denies any symptoms.   Rechecked pt.s BP via manual and I received 82/54

## 2016-06-23 NOTE — H&P (Signed)
History and Physical  Gwendolyn Alvarado [id number] DOB: [birth year] DOA: 06/23/2016  Referring physician: Dr Laverta Baltimore, ED physician PCP: Gara Kroner, MD  Outpatient Specialists:   Dr. Lovena Le (cardiology)  Chief Complaint: Abdominal pain  HPI: Gwendolyn Alvarado is a 70 y.o. female with a history of diverticulosis, rhythm controlled atrial fibrillation, and angioma cavernosum. Patient seen for 2 days of worsening abdominal pain that is worse in the left lower quadrant. Patient has had some nausea but denies fevers, chills, vomiting. She has known diverticulosis but has never had diverticulitis. She denies blood in her stools or melena. No palliating or provoking factors.  Emergency Department Course: Patient treated with IV Cipro and Flagyl and was situated for discharge to home, however it was noted that her blood pressures were in the [id number] systolically after 2 L. I was asked to admit the patient for observation overnight due to her hypotension.  Review of Systems:   Pt denies any fevers, chills, vomiting, diarrhea, constipation, shortness of breath, dyspnea on exertion, orthopnea, cough, wheezing, palpitations, headache, vision changes, lightheadedness, dizziness, melena, rectal bleeding.  Review of systems are otherwise negative  Past Medical History:  Diagnosis Date  . Angioma cavernosum   . Atrial fibrillation ([place])   . HTN (hypertension)   . Irritable bowel syndrome (IBS)   . Seizure disorder [facility])    Past Surgical History:  Procedure Laterality Date  . ABDOMINAL HYSTERECTOMY    . Bartholins gland cyst resection    . benign breast biopsy    . CHOLECYSTECTOMY    . SALIVARY GLAND SURGERY     Social History:  reports that she has never smoked. She has never used smokeless tobacco. She reports that she does not drink alcohol or use drugs. Patient lives at [facility]  . Carbamazepine Hives and Other (See Comments)    Blisters similar to [employer] Syndrome    . Dilantin [Phenytoin Sodium Extended] Hives and Other (See Comments)    Blisters similar to Remo Lipps Johnston's Syndrome Blisters  . Doxycycline Nausea And Vomiting    Made her sick for about 6 weeks & lost 30 lbs  . Hydrocodone-Acetaminophen Nausea And Vomiting  . Phenytoin Hives  . Penicillins Itching and Rash    Family History  Problem Relation Age of Onset  . Stroke Mother   . Lung cancer Mother   . Seizures Neg Hx       Prior to Admission medications   Medication Sig Start Date End Date Taking? Authorizing Provider  flecainide (TAMBOCOR) 50 MG tablet TAKE 1 TABLET(50 MG) BY MOUTH TWICE DAILY 02/29/16  Yes Evans Lance, MD  Lacosamide (VIMPAT) 150 MG TABS Take 1 tablet by mouth 2 (two) times daily.     Yes Historical Provider, MD  metoprolol succinate (TOPROL-XL) 25 MG 24 hr tablet Take 1 tablet (25 mg total) by mouth daily. 02/29/16  Yes Evans Lance, MD  ciprofloxacin (CIPRO) 500 MG tablet Take 1 tablet (500 mg total) by mouth 2 (two) times daily. 06/23/16   Ezequiel Essex, MD  metroNIDAZOLE (FLAGYL) 500 MG tablet Take 1 tablet (500 mg total) by mouth 2 (two) times daily. 06/23/16   Ezequiel Essex, MD  ondansetron (ZOFRAN) 4 MG tablet Take 1 tablet (4 mg total) by mouth every 6 (six) hours. 06/23/16   Ezequiel Essex, MD    Physical Exam: BP (!) 86/65 (BP Location: Left Arm)   Pulse 71   Temp 99.4 F (37.4 C) (Oral)  Resp 20   Ht 5\' 7"  (1.702 m)   Wt 70.8 kg (156 lb)   SpO2 99%   BMI 24.43 kg/m   General: Older Caucasian female who appears younger than stated age. Awake and alert and oriented x3. No acute cardiopulmonary distress.  HEENT: Normocephalic atraumatic.  Right and left ears normal in appearance.  Pupils equal, round, reactive to light. Extraocular muscles are intact. Sclerae anicteric and noninjected.  Moist mucosal membranes. No mucosal lesions.  Neck: Neck supple without lymphadenopathy. No carotid bruits. No masses palpated.  Cardiovascular: Regular  rate with normal S1-S2 sounds. No murmurs, rubs, gallops auscultated. No JVD.  Respiratory: Good respiratory effort with no wheezes, rales, rhonchi. Lungs clear to auscultation bilaterally.  No accessory muscle use. Abdomen: Soft, nondistended. Tender in the left lower quadrant with mild rebound and no guarding. Active bowel sounds. No masses or hepatosplenomegaly  Skin: No rashes, lesions, or ulcerations.  Dry, warm to touch. 2+ dorsalis pedis and radial pulses. Musculoskeletal: No calf or leg pain. All major joints not erythematous nontender.  No upper or lower joint deformation.  Good ROM.  No contractures  Psychiatric: Intact judgment and insight. Pleasant and cooperative. Neurologic: No focal neurological deficits. Strength is 5/5 and symmetric in upper and lower extremities.  Cranial nerves II through XII are grossly intact.           Labs on Admission: I have personally reviewed following labs and imaging studies  CBC:  Recent Labs Lab 06/23/16 0030  WBC 13.1*  HGB 14.0  HCT 40.9  MCV 91.9  PLT [id number]   Basic Metabolic Panel:  Recent Labs Lab 06/23/16 0030  NA 137  K 4.2  CL 104  CO2 23  GLUCOSE 109*  BUN 10  CREATININE 0.74  CALCIUM 9.0   GFR: Estimated Creatinine Clearance (by C-G formula based on SCr of 0.74 mg/dL) Female: 62.7 mL/min Female: 79.2 mL/min Liver Function Tests:  Recent Labs Lab 06/23/16 0030  AST 38  ALT 40  ALKPHOS 82  BILITOT 1.7*  PROT 6.8  ALBUMIN 3.8    Recent Labs Lab 06/23/16 0030  LIPASE 31   No results for input(s): AMMONIA in the last 168 hours. Coagulation Profile: No results for input(s): INR, PROTIME in the last 168 hours. Cardiac Enzymes: No results for input(s): CKTOTAL, CKMB, CKMBINDEX, TROPONINI in the last 168 hours. BNP (last 3 results) No results for input(s): PROBNP in the last 8760 hours. HbA1C: No results for input(s): HGBA1C in the last 72 hours. CBG: No results for input(s): GLUCAP in the last 168  hours. Lipid Profile: No results for input(s): CHOL, HDL, LDLCALC, TRIG, CHOLHDL, LDLDIRECT in the last 72 hours. Thyroid Function Tests: No results for input(s): TSH, T4TOTAL, FREET4, T3FREE, THYROIDAB in the last 72 hours. Anemia Panel: No results for input(s): VITAMINB12, FOLATE, FERRITIN, TIBC, IRON, RETICCTPCT in the last 72 hours. Urine analysis:    Component Value Date/Time   COLORURINE AMBER (A) 06/23/2016 0030   APPEARANCEUR CLEAR 06/23/2016 0030   LABSPEC 1.017 06/23/2016 0030   PHURINE 6.0 06/23/2016 0030   GLUCOSEU NEGATIVE 06/23/2016 0030   HGBUR TRACE (A) 06/23/2016 0030   BILIRUBINUR NEGATIVE 06/23/2016 0030   KETONESUR 40 (A) 06/23/2016 0030   PROTEINUR NEGATIVE 06/23/2016 0030   NITRITE NEGATIVE 06/23/2016 0030   LEUKOCYTESUR NEGATIVE 06/23/2016 0030   Sepsis Labs: @LABRCNTIP (procalcitonin:4,lacticidven:4) )No results found for this or any previous visit (from the past 240 hour(s)).   Radiological Exams on Admission: Dg Chest 2 View  Result Date: 06/23/2016 CLINICAL DATA:  Lower abdominal pain with cough and chills, onset this morning. EXAM: CHEST  2 VIEW COMPARISON:  11/17/2010 FINDINGS: The lungs are clear. The pulmonary vasculature is normal. Heart size is normal. Hilar and mediastinal contours are unremarkable. There is no pleural effusion. IMPRESSION: No active cardiopulmonary disease. Electronically Signed   By: Andreas Newport M.D.   On: 06/23/2016 04:35   Ct Head Wo Contrast  Result Date: 06/23/2016 CLINICAL DATA:  Headache, fever. EXAM: CT HEAD WITHOUT CONTRAST TECHNIQUE: Contiguous axial images were obtained from the base of the skull through the vertex without intravenous contrast. COMPARISON:  08/30/2013 FINDINGS: Brain: 1.5 cm hyperdense area noted in the medial left temporal lobe is stable, shown on prior imaging to represent a cavernous angioma no hemorrhage, hydrocephalus or acute infarction. No mass effect or midline shift. Vascular: No  hyperdense vessel or unexpected calcification. Skull: No acute calvarial abnormality. Sinuses/Orbits: Visualized paranasal sinuses and mastoids clear. Orbital soft tissues unremarkable. 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 No acute intracranial abnormality. Electronically Signed   By: Rolm Baptise M.D.   On: 06/23/2016 07:53   Ct Abdomen Pelvis W Contrast  Result Date: 06/23/2016 CLINICAL DATA:  Low abdominal pain.  Back pain.  Leukocytosis. EXAM: CT ABDOMEN AND PELVIS WITH CONTRAST TECHNIQUE: Multidetector CT imaging of the abdomen and pelvis was performed using the standard protocol following bolus administration of intravenous contrast. CONTRAST:  144mL ISOVUE-300 IOPAMIDOL (ISOVUE-300) INJECTION 61% COMPARISON:  07/02/2006 FINDINGS: Lower chest:  Calcified granuloma in the right lower lobe. Hepatobiliary: No focal liver abnormality.Cholecystectomy with normal common bile duct diameter. Pancreas: Unremarkable. Spleen: Unremarkable. Adrenals/Urinary Tract: Negative adrenals. Two left renal calculi measuring up to 3 mm. Simple right renal cysts. Exophytic mass from the lower pole right kidney measuring 22 mm, with internal fat component, diminished in size from prior. No hydronephrosis or ureteral calculus. Negative urinary bladder. Stomach/Bowel: Extensive colonic diverticulosis with active inflammation at a proximal sigmoid diverticulum. No free perforation or abscess. No bowel obstruction. No pericecal inflammation. Vascular/Lymphatic: No acute vascular finding. No mass or adenopathy. Reproductive:Hysterectomy. Other: No ascites or pneumoperitoneum. Musculoskeletal: No acute or aggressive finding. T10 vertebral body hemangioma, incidental. Lumbar disc and facet degeneration. IMPRESSION: 1. Sigmoid diverticulitis without abscess or free perforation. 2.  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 3. Left nephrolithiasis. Electronically Signed   By: Monte Fantasia M.D.    On: 06/23/2016 07:57    Assessment/Plan: Principal Problem:   Hypotension Active Problems:   Essential hypertension   PAROXYSMAL ATRIAL FIBRILLATION   Diverticulitis of large intestine without perforation or abscess without bleeding    This patient was discussed with the ED physician, including pertinent vitals, physical exam findings, labs, and imaging.  We also discussed care given by the ED provider.  #1 hypotension  The patient appears well and without a lactic acidosis. I do not believe that this represents sepsis  Hold metoprolol  Observation on telemetry  Orthostatics  Continue IV hydration overnight #2 diverticulitis  Cipro and Flagyl  CBC in the morning #3 paroxysmal atrial fibrillation  Rhythm controlled  Continue flecainide #4 hypertension  Old metoprolol for now  DVT prophylaxis: SCDs Consultants: None Code Status: Full code Family Communication: Husband in the room  Disposition Plan: Patient should be able to return home following observation   Truett Mainland, DO [facility] Pager [phone]  If 7PM-7AM, please contact night-coverage [email or web address] Password [email or web address]

## 2016-06-23 NOTE — ED Notes (Signed)
Patient transported to CT 

## 2016-06-23 NOTE — ED Notes (Signed)
Pt. 's son has arrived, and pt. Is eating lunch.

## 2016-06-23 NOTE — ED Provider Notes (Signed)
[facility] DEPT Provider Note   CSN: [id number] Arrival date & time: 06/23/16  0005  By signing my name below, I, Irene Pap, attest that this documentation has been prepared under the direction and in the presence of Ezequiel Essex, MD. Electronically Signed: Irene Pap, ED Scribe. 06/23/16. 3:59 AM.  History   Chief Complaint Chief Complaint  Patient presents with  . Abdominal Pain   The history is provided by the patient. No language interpreter was used.  HPI Comments: Gwendolyn Alvarado is a 70 y.o. female with a hx of cholecystectomy, appendectomy, hysterectomy, HTN, A-Fib, and IBS who presents to the Emergency Department complaining of gradually worsening lower abdominal pain that radiates to the back onset one day ago. Pt reports associated chills, headache, generalized myalgias, non-productive cough, mild SOB, rhinorrhea, and fatigue. Pt's headache worsens with coughing. Pt has not taken anything for her symptoms. She denies sick contacts, fever, sore throat, diarrhea, nausea, vomiting, bladder or bowel incontinence, dysuria, or hematuria. Pt is allergic to Carbamazepine; Dilantin [phenytoin sodium extended]; Doxycycline; Hydrocodone-acetaminophen; Phenytoin; and Penicillins. Pt did not have a flu vaccination this year.  Past Medical History:  Diagnosis Date  . Angioma cavernosum   . Atrial fibrillation ([place])   . HTN (hypertension)   . Irritable bowel syndrome (IBS)   . Seizure disorder [facility])     Patient Active Problem List   Diagnosis Date Noted  . Other hypertrophic cardiomyopathy ([place]) 11/16/2013  . Hemangioma 02/13/2009  . Essential hypertension 02/13/2009  . PAROXYSMAL ATRIAL FIBRILLATION 02/13/2009    Past Surgical History:  Procedure Laterality Date  . ABDOMINAL HYSTERECTOMY    . Bartholins gland cyst resection    . benign breast biopsy    . CHOLECYSTECTOMY    . SALIVARY GLAND SURGERY      OB History    No data available     Home Medications     Prior to Admission medications   Medication Sig Start Date End Date Taking? Authorizing Provider  flecainide (TAMBOCOR) 50 MG tablet TAKE 1 TABLET(50 MG) BY MOUTH TWICE DAILY 02/29/16   Evans Lance, MD  hyoscyamine (LEVSIN, ANASPAZ) 0.125 MG tablet Take 0.125 mg by mouth every 4 (four) hours as needed for cramping (Can take an extra tablet if needed).     Historical Provider, MD  Lacosamide (VIMPAT) 150 MG TABS Take 1 tablet by mouth 2 (two) times daily.      Historical Provider, MD  metoprolol succinate (TOPROL-XL) 25 MG 24 hr tablet Take 1 tablet (25 mg total) by mouth daily. 02/29/16   Evans Lance, MD    Family History Family History  Problem Relation Age of Onset  . Stroke Mother   . Lung cancer Mother   . Seizures Neg Hx     Social History Social History  Substance Use Topics  . Smoking status: Never Smoker  . Smokeless tobacco: Never Used  . Alcohol use No     Allergies   Carbamazepine; Dilantin [phenytoin sodium extended]; Doxycycline; Hydrocodone-acetaminophen; Phenytoin; and Penicillins  Review of Systems Review of Systems  Constitutional: Positive for chills and fatigue.  HENT: Positive for rhinorrhea. Negative for sore throat.   Respiratory: Positive for cough and shortness of breath.   Gastrointestinal: Positive for abdominal pain. Negative for diarrhea, nausea and vomiting.  Genitourinary: Negative for dysuria and hematuria.  Musculoskeletal: Positive for myalgias.  Neurological: Positive for headaches.  All other systems reviewed and are negative.  Physical Exam Updated Vital Signs BP 130/66 (BP Location:  Right Arm)   Pulse 86   Temp 98.8 F (37.1 C) (Oral)   Resp 17   Ht 5\' 7"  (1.702 m)   Wt 156 lb (70.8 kg)   SpO2 98%   BMI 24.43 kg/m   Physical Exam  Constitutional: She is oriented to person, place, and time. She appears well-developed and well-nourished. She appears ill. No distress.  HENT:  Head: Normocephalic and atraumatic.   Mouth/Throat: Oropharynx is clear and moist. Mucous membranes are dry. No oropharyngeal exudate.  Eyes: Conjunctivae and EOM are normal. Pupils are equal, round, and reactive to light.  Neck: Normal range of motion. Neck supple.  No meningismus.  Cardiovascular: Normal rate, regular rhythm, normal heart sounds and intact distal pulses.   No murmur heard. Pulmonary/Chest: Effort normal and breath sounds normal. No respiratory distress.  Abdominal: Soft. There is tenderness. There is no rebound, no guarding and no CVA tenderness.  Diffuse lower abdominal tenderness with voluntary guarding  Musculoskeletal: Normal range of motion. She exhibits no edema or tenderness.  Neurological: She is alert and oriented to person, place, and time. No cranial nerve deficit. She exhibits normal muscle tone. Coordination normal.   5/5 strength throughout. CN 2-12 intact.Equal grip strength.   Skin: Skin is warm.  Psychiatric: She has a normal mood and affect. Her behavior is normal.  Nursing note and vitals reviewed.  ED Treatments / Results  DIAGNOSTIC STUDIES: Oxygen Saturation is 98% on RA, normal by my interpretation.    COORDINATION OF CARE: 3:57 AM-Discussed treatment plan which includes labs and CT scan with pt at bedside and pt agreed to plan.    Labs (all labs ordered are listed, but only abnormal results are displayed) Labs Reviewed  COMPREHENSIVE METABOLIC PANEL - Abnormal; Notable for the following:       Result Value   Glucose, Bld 109 (*)    Total Bilirubin 1.7 (*)    All other components within normal limits  CBC - Abnormal; Notable for the following:    WBC 13.1 (*)    All other components within normal limits  URINALYSIS, ROUTINE W REFLEX MICROSCOPIC (NOT AT [facility]) - Abnormal; Notable for the following:    Color, Urine AMBER (*)    Hgb urine dipstick TRACE (*)    Ketones, ur 40 (*)    All other components within normal limits  URINE MICROSCOPIC-ADD ON - Abnormal; Notable for  the following:    Squamous Epithelial / LPF 0-5 (*)    Bacteria, UA MANY (*)    All other components within normal limits  LIPASE, BLOOD    EKG  EKG Interpretation None       Radiology Dg Chest 2 View  Result Date: 06/23/2016 CLINICAL DATA:  Lower abdominal pain with cough and chills, onset this morning. EXAM: CHEST  2 VIEW COMPARISON:  11/17/2010 FINDINGS: The lungs are clear. The pulmonary vasculature is normal. Heart size is normal. Hilar and mediastinal contours are unremarkable. There is no pleural effusion. IMPRESSION: No active cardiopulmonary disease. Electronically Signed   By: Andreas Newport M.D.   On: 06/23/2016 04:35   Ct Head Wo Contrast  Result Date: 06/23/2016 CLINICAL DATA:  Headache, fever. EXAM: CT HEAD WITHOUT CONTRAST TECHNIQUE: Contiguous axial images were obtained from the base of the skull through the vertex without intravenous contrast. COMPARISON:  08/30/2013 FINDINGS: Brain: 1.5 cm hyperdense area noted in the medial left temporal lobe is stable, shown on prior imaging to represent a cavernous angioma no hemorrhage, hydrocephalus or  acute infarction. No mass effect or midline shift. Vascular: No hyperdense vessel or unexpected calcification. Skull: No acute calvarial abnormality. Sinuses/Orbits: Visualized paranasal sinuses and mastoids clear. Orbital soft tissues unremarkable. 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 No acute intracranial abnormality. Electronically Signed   By: Rolm Baptise M.D.   On: 06/23/2016 07:53   Ct Abdomen Pelvis W Contrast  Result Date: 06/23/2016 CLINICAL DATA:  Low abdominal pain.  Back pain.  Leukocytosis. EXAM: CT ABDOMEN AND PELVIS WITH CONTRAST TECHNIQUE: Multidetector CT imaging of the abdomen and pelvis was performed using the standard protocol following bolus administration of intravenous contrast. CONTRAST:  150mL ISOVUE-300 IOPAMIDOL (ISOVUE-300) INJECTION 61% COMPARISON:   07/02/2006 FINDINGS: Lower chest:  Calcified granuloma in the right lower lobe. Hepatobiliary: No focal liver abnormality.Cholecystectomy with normal common bile duct diameter. Pancreas: Unremarkable. Spleen: Unremarkable. Adrenals/Urinary Tract: Negative adrenals. Two left renal calculi measuring up to 3 mm. Simple right renal cysts. Exophytic mass from the lower pole right kidney measuring 22 mm, with internal fat component, diminished in size from prior. No hydronephrosis or ureteral calculus. Negative urinary bladder. Stomach/Bowel: Extensive colonic diverticulosis with active inflammation at a proximal sigmoid diverticulum. No free perforation or abscess. No bowel obstruction. No pericecal inflammation. Vascular/Lymphatic: No acute vascular finding. No mass or adenopathy. Reproductive:Hysterectomy. Other: No ascites or pneumoperitoneum. Musculoskeletal: No acute or aggressive finding. T10 vertebral body hemangioma, incidental. Lumbar disc and facet degeneration. IMPRESSION: 1. Sigmoid diverticulitis without abscess or free perforation. 2.  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 3. Left nephrolithiasis. Electronically Signed   By: Monte Fantasia M.D.   On: 06/23/2016 07:57    Procedures Procedures (including critical care time)  Medications Ordered in ED Medications - No data to display   Initial Impression / Assessment and Plan / ED Course  I have reviewed the triage vital signs and the nursing notes.  Pertinent labs & imaging results that were available during my care of the patient were reviewed by me and considered in my medical decision making (see chart for details).  Clinical Course    Patient with lower abdominal pain, back pain, subjective chills and fever, cough, rhinorrhea, headache.    Dry MM. No meningismus. No focal neuro exam Diffuse lower abdominal tenderness with guarding. UA with bacteruria only. Will send culture.  IVF, labs, CXR, CT a/p.  CT head will also be obtained given  her headache and history of angioma.  Workup shows uncomplicated sigmoid diverticulitis. No perforation or abscess. Patient tolerating by mouth her pain is well-controlled.  Treatment with cipro and flagyl. Followup with PCP.pain and nausea controlled.  Return to the ED if develop new or worsening symptoms. Final Clinical Impressions(s) / ED Diagnoses   Final diagnoses:  Diverticulitis of large intestine without perforation or abscess without bleeding   I personally performed the services described in this documentation, which was scribed in my presence. The recorded information has been reviewed and is accurate.   New Prescriptions New Prescriptions   No medications on file     Ezequiel Essex, MD 06/23/16 [phone]

## 2016-06-23 NOTE — ED Notes (Signed)
Patient transported to X-ray 

## 2016-06-23 NOTE — Discharge Instructions (Signed)
Take the antibiotics as prescribed. Followup with your doctor. Return to the ED if you develop worsening pain, vomiting, or any other concerns.

## 2016-06-23 NOTE — ED Provider Notes (Signed)
Blood pressure (!) 87/59, pulse 79, temperature 99.6 F (37.6 C), temperature source Oral, resp. rate 16, height 5\' 7"  (1.702 m), weight 156 lb (70.8 kg), SpO2 96 %.  Assuming care from Dr. Wyvonnia Dusky.  In short, Gwendolyn Alvarado is a 70 y.o. female with a chief complaint of Abdominal Pain .  Refer to the original H&P for additional details.  The current plan of care is to follow along and reassess after abx.  11:10 AM Call to patient bedside by nursing staff with persistent hypotension. While receiving antibiotics in the emergency department for uncomplicated diverticulitis the patient has had multiple readings of blood pressure in the 80s including a manual blood pressure. My evaluation of the patient she is very well-appearing and is feeling better. I'm concerned that these low blood pressures may indicate a slightly worsening infection despite early intervention with antibiotics. Looking back at old charts the patient typically has blood pressure in the 120s. No A. Fib/RVR. Plan for continued IV fluid hydration observational admission given interval development of hypotension.  Discussed patient's case with hospitalist, Dr. Nehemiah Settle.  Recommend admission to observation, telemetry bed.  I will place holding orders per their request. Patient and family (if present) updated with plan. Care transferred to hospitalist service.  I reviewed all nursing notes, vitals, pertinent old records, EKGs, labs, imaging (as available).   CRITICAL CARE Performed by: Margette Fast Total critical care time: 35 minutes Critical care time was exclusive of separately billable procedures and treating other patients. Critical care was necessary to treat or prevent imminent or life-threatening deterioration. Critical care was time spent personally by me on the following activities: development of treatment plan with patient and/or surrogate as well as nursing, discussions with consultants, evaluation of patient's response to  treatment, examination of patient, obtaining history from patient or surrogate, ordering and performing treatments and interventions, ordering and review of laboratory studies, ordering and review of radiographic studies, pulse oximetry and re-evaluation of patient's condition.  Nanda Quinton, MD Emergency Medicine  Nanda Quinton, MD    Margette Fast, MD 06/24/16 1055

## 2016-06-23 NOTE — ED Notes (Signed)
Pt given sprite 

## 2016-06-23 NOTE — ED Notes (Signed)
Pt. oob to the bedside commode.   Gait steady.  Pt denies any dizziness, pain.  Will continue to monitor.

## 2016-06-23 NOTE — ED Notes (Signed)
Assisted pt. To the [facility] to urinate.  Pt. Became lightheaded and stated. "I don't feel good."  When asked if she was having pain or sob , pt. Stated, "No, I just feel woosie".  Assisted pt.  Back into the bed.  Pt. Denies any n/v/d, Pt., denies any pain, Skin is pink, warm and dry,.  Vitals being taken

## 2016-06-23 NOTE — ED Triage Notes (Signed)
The pt is c/o lower abd pain with chills cough and leg pain since this am maybe a temp not feeling well in general

## 2016-06-24 ENCOUNTER — Telehealth: Payer: Self-pay | Admitting: Internal Medicine

## 2016-06-24 DIAGNOSIS — A419 Sepsis, unspecified organism: Secondary | ICD-10-CM | POA: Diagnosis not present

## 2016-06-24 DIAGNOSIS — I480 Paroxysmal atrial fibrillation: Secondary | ICD-10-CM | POA: Diagnosis not present

## 2016-06-24 DIAGNOSIS — I959 Hypotension, unspecified: Secondary | ICD-10-CM | POA: Diagnosis not present

## 2016-06-24 DIAGNOSIS — K5732 Diverticulitis of large intestine without perforation or abscess without bleeding: Secondary | ICD-10-CM | POA: Diagnosis not present

## 2016-06-24 DIAGNOSIS — I10 Essential (primary) hypertension: Secondary | ICD-10-CM | POA: Diagnosis not present

## 2016-06-24 LAB — CBC
HCT: 33.9 % — ABNORMAL LOW (ref 36.0–46.0)
Hemoglobin: 11.2 g/dL — ABNORMAL LOW (ref 12.0–15.0)
MCH: 30.6 pg (ref 26.0–34.0)
MCHC: 33 g/dL (ref 30.0–36.0)
MCV: 92.6 fL (ref 78.0–100.0)
Platelets: 198 10*3/uL (ref 150–400)
RBC: 3.66 MIL/uL — ABNORMAL LOW (ref 3.87–5.11)
RDW: 13.7 % (ref 11.5–15.5)
WBC: 7 10*3/uL (ref 4.0–10.5)

## 2016-06-24 MED ORDER — CIPROFLOXACIN HCL 500 MG PO TABS
500.0000 mg | ORAL_TABLET | Freq: Two times a day (BID) | ORAL | Status: DC
  Administered 2016-06-24 – 2016-06-25 (×2): 500 mg via ORAL
  Filled 2016-06-24 (×2): qty 1

## 2016-06-24 MED ORDER — METRONIDAZOLE 500 MG PO TABS
500.0000 mg | ORAL_TABLET | Freq: Three times a day (TID) | ORAL | Status: DC
  Administered 2016-06-24 – 2016-06-25 (×3): 500 mg via ORAL
  Filled 2016-06-24 (×3): qty 1

## 2016-06-24 NOTE — Telephone Encounter (Signed)
Called, spoke with pt. Pt currently admitted at [facility]. Pt is being d/c'd tomorrow. Pt want to know if she should continue on Metoprolol. Pt informed BP was very low upon admission on 06/22/16 (87/59). Pt dx was diverticulitis. Pt stated her Metoprolol has been on hold while admitted. Pt's most recent BP's are 139/74 and 143/60. Pt stated dr at hospital requested pt to contact her cardiologist for recommendation on continuing Metoprolol. I informed pt she will get d/c instructions from the hospital, which will outline medication pt should stop/start. Informed I will forward to Dr. Lovena Le and send a msg to scheduling (to get a follow-up appt scheduled). Pt verbalized understanding and agreed with plan. Pt thanked me for calling.

## 2016-06-24 NOTE — Care Management Obs Status (Signed)
[facility] NOTIFICATION   Patient Details  Name: [name] MRN: [id number] Date of Birth: 1946/07/10   Medicare Observation Status Notification Given:  Yes    Carles Collet, RN 06/24/2016, 11:30 AM

## 2016-06-24 NOTE — Telephone Encounter (Signed)
°  New Prob   Pt was admitted to the hospital for diverticulitis. Pts blood pressure was also low and she was taken off of Metoprolol. Calling to speak to RN regarding plans moving forward. Please call.

## 2016-06-24 NOTE — Progress Notes (Signed)
[facility] PROGRESS NOTE    Progress Note  Gwendolyn Alvarado  [id number] DOB: September 10, 1945 DOA: 06/23/2016 PCP: Gara Kroner, MD     Brief Narrative:   Gwendolyn Alvarado is an 70 y.o. female spiral history of diverticulosis, right atrial fibrillation that comes in for 2 days of worsening of lower quadrant pain with some nausea but no vomiting, became hypotensive in the ED was fluid resuscitated and was started empirically on IV ciprofloxacin and Flagyl, CT scan of the abdomen and pelvis showed diverticulitis.  Assessment/Plan:   Sepsis due to Diverticulitis of large intestine without perforation or abscess without bleeding; Left acid was less than 2, patient responded to fluid resuscitation. He does meet criteria for sepsis with elevated leukocytosis, mild hypotension and a source of infection. He was started empirically on IV Cipro Floxin and Flagyl. Has remained afebrile leukocytosis has resolved. He is tolerating diet. Change her antibiotic coverage to oral. Advance diet.  Hypotension: Resolved, continue IV fluid hydration. Check orthostatic vitals.  Essential hypertension Blood pressure improved continue hold and hypertensive medication.  PAROXYSMAL ATRIAL FIBRILLATION Now on Flecanaide, remains sinus rhythm   DVT prophylaxis: lovenox Family Communication:none Disposition Plan/Barrier to D/C: home in 1-2 days Code Status:     Code Status Orders        Start     Ordered   06/23/16 1617  Full code  Continuous     06/23/16 1616    Code Status History    Date Active Date Inactive Code Status Order ID Comments User Context   This patient has a current code status but no historical code status.        IV Access:    Peripheral IV   Procedures and diagnostic studies:   Dg Chest 2 View  Result Date: 06/23/2016 CLINICAL DATA:  Lower abdominal pain with cough and chills, onset this morning. EXAM: CHEST  2 VIEW COMPARISON:  11/17/2010 FINDINGS: The lungs are  clear. The pulmonary vasculature is normal. Heart size is normal. Hilar and mediastinal contours are unremarkable. There is no pleural effusion. IMPRESSION: No active cardiopulmonary disease. Electronically Signed   By: Andreas Newport M.D.   On: 06/23/2016 04:35   Ct Head Wo Contrast  Result Date: 06/23/2016 CLINICAL DATA:  Headache, fever. EXAM: CT HEAD WITHOUT CONTRAST TECHNIQUE: Contiguous axial images were obtained from the base of the skull through the vertex without intravenous contrast. COMPARISON:  08/30/2013 FINDINGS: Brain: 1.5 cm hyperdense area noted in the medial left temporal lobe is stable, shown on prior imaging to represent a cavernous angioma no hemorrhage, hydrocephalus or acute infarction. No mass effect or midline shift. Vascular: No hyperdense vessel or unexpected calcification. Skull: No acute calvarial abnormality. Sinuses/Orbits: Visualized paranasal sinuses and mastoids clear. Orbital soft tissues unremarkable. 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 No acute intracranial abnormality. Electronically Signed   By: Rolm Baptise M.D.   On: 06/23/2016 07:53   Ct Abdomen Pelvis W Contrast  Result Date: 06/23/2016 CLINICAL DATA:  Low abdominal pain.  Back pain.  Leukocytosis. EXAM: CT ABDOMEN AND PELVIS WITH CONTRAST TECHNIQUE: Multidetector CT imaging of the abdomen and pelvis was performed using the standard protocol following bolus administration of intravenous contrast. CONTRAST:  166mL ISOVUE-300 IOPAMIDOL (ISOVUE-300) INJECTION 61% COMPARISON:  07/02/2006 FINDINGS: Lower chest:  Calcified granuloma in the right lower lobe. Hepatobiliary: No focal liver abnormality.Cholecystectomy with normal common bile duct diameter. Pancreas: Unremarkable. Spleen: Unremarkable. Adrenals/Urinary Tract: Negative adrenals. Two left renal calculi  measuring up to 3 mm. Simple right renal cysts. Exophytic mass from the lower pole right kidney measuring 22  mm, with internal fat component, diminished in size from prior. No hydronephrosis or ureteral calculus. Negative urinary bladder. Stomach/Bowel: Extensive colonic diverticulosis with active inflammation at a proximal sigmoid diverticulum. No free perforation or abscess. No bowel obstruction. No pericecal inflammation. Vascular/Lymphatic: No acute vascular finding. No mass or adenopathy. Reproductive:Hysterectomy. Other: No ascites or pneumoperitoneum. Musculoskeletal: No acute or aggressive finding. T10 vertebral body hemangioma, incidental. Lumbar disc and facet degeneration. IMPRESSION: 1. Sigmoid diverticulitis without abscess or free perforation. 2.  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 3. Left nephrolithiasis. Electronically Signed   By: Monte Fantasia M.D.   On: 06/23/2016 07:57     Medical Consultants:    None.  Anti-Infectives:   Cipro Flagyl.  Subjective:    Gwendolyn Alvarado she feels much better than yesterday, her abdominal pain is improved, she is tolerating her diet.  Objective:    Vitals:   06/23/16 1633 06/23/16 2210 06/24/16 0323 06/24/16 0614  BP:  (!) 104/46 (!) 106/51 (!) 97/51  Pulse:  91 65 67  Resp:  18  18  Temp:  99.6 F (37.6 C)  98.1 F (36.7 C)  TempSrc:  Oral  Oral  SpO2:  97%  97%  Weight: 76.4 kg (168 lb 6.4 oz)     Height: 5\' 6"  (1.676 m)       Intake/Output Summary (Last 24 hours) at 06/24/16 0929 Last data filed at 06/24/16 [id number]  Gross per 24 hour  Intake          3989.17 ml  Output                0 ml  Net          3989.17 ml   Filed Weights   06/23/16 0008 06/23/16 1633  Weight: 70.8 kg (156 lb) 76.4 kg (168 lb 6.4 oz)    Exam: General exam: In no acute distress. Respiratory system: Good air movement and clear to auscultation. Cardiovascular system: S1 & S2 heard, RRR. No JVD. Gastrointestinal system: Abdomen is nondistended, soft and nontender.  Extremities: No pedal edema. Skin: No rashes, lesions or ulcers Psychiatry: Judgement and  insight appear normal. Mood & affect appropriate.    Data Reviewed:    Labs: Basic Metabolic Panel:  Recent Labs Lab 06/23/16 0030  NA 137  K 4.2  CL 104  CO2 23  GLUCOSE 109*  BUN 10  CREATININE 0.74  CALCIUM 9.0   GFR Estimated Creatinine Clearance (by C-G formula based on SCr of 0.74 mg/dL) Female: 67.3 mL/min Female: 76.4 mL/min Liver Function Tests:  Recent Labs Lab 06/23/16 0030  AST 38  ALT 40  ALKPHOS 82  BILITOT 1.7*  PROT 6.8  ALBUMIN 3.8    Recent Labs Lab 06/23/16 0030  LIPASE 31   No results for input(s): AMMONIA in the last 168 hours. Coagulation profile No results for input(s): INR, PROTIME in the last 168 hours.  CBC:  Recent Labs Lab 06/23/16 0030 06/24/16 0506  WBC 13.1* 7.0  HGB 14.0 11.2*  HCT 40.9 33.9*  MCV 91.9 92.6  PLT 232 198   Cardiac Enzymes: No results for input(s): CKTOTAL, CKMB, CKMBINDEX, TROPONINI in the last 168 hours. BNP (last 3 results) No results for input(s): PROBNP in the last 8760 hours. CBG: No results for input(s): GLUCAP in the last 168 hours. D-Dimer: No results for input(s): DDIMER in the last  72 hours. Hgb A1c: No results for input(s): HGBA1C in the last 72 hours. Lipid Profile: No results for input(s): CHOL, HDL, LDLCALC, TRIG, CHOLHDL, LDLDIRECT in the last 72 hours. Thyroid function studies: No results for input(s): TSH, T4TOTAL, T3FREE, THYROIDAB in the last 72 hours.  Invalid input(s): FREET3 Anemia work up: No results for input(s): VITAMINB12, FOLATE, FERRITIN, TIBC, IRON, RETICCTPCT in the last 72 hours. Sepsis Labs:  Recent Labs Lab 06/23/16 0030 06/23/16 0426 06/23/16 0904 06/24/16 0506  WBC 13.1*  --   --  7.0  LATICACIDVEN  --  0.69 0.49*  --    Microbiology No results found for this or any previous visit (from the past 240 hour(s)).   Medications:   . ciprofloxacin  400 mg Intravenous Q12H  . flecainide  50 mg Oral Q12H  . lacosamide  150 mg Oral BID  .  metronidazole  500 mg Intravenous Q8H   Continuous Infusions: . sodium chloride 125 mL/hr at 06/24/16 0243    Time spent: 25 min   LOS: 0 days   Charlynne Cousins  [facility] Pager [phone]  *Please refer to [email or web address], password [email or web address] to get updated schedule on who will round on this patient, as hospitalists switch teams weekly. If 7PM-7AM, please contact night-coverage at [email or web address], password [email or web address] for any overnight needs.  06/24/2016, 9:29 AM

## 2016-06-24 NOTE — Care Management Note (Signed)
Case Management Note  Patient Details  Name: Gwendolyn Alvarado MRN: [id number] Date of Birth: 07/20/1946  Subjective/Objective:                 Spoke to patient and spouse at bedside. Patient independent from home, in obs for diverticulitis. No CM needs identified. Remains on IVF and IV abx.   Action/Plan:  Anticipate DC to home. Self care tomorrow.   Expected Discharge Date:                  Expected Discharge Plan:  Home/Self Care  In-House Referral:     Discharge planning Services  CM Consult  Post Acute Care Choice:    Choice offered to:     DME Arranged:    DME Agency:     HH Arranged:    [place] Agency:     Status of Service:  Completed, signed off  If discussed at [employer] of Stay Meetings, dates discussed:    Additional Comments:  Carles Collet, RN 06/24/2016, 12:11 PM

## 2016-06-25 DIAGNOSIS — I10 Essential (primary) hypertension: Secondary | ICD-10-CM | POA: Diagnosis not present

## 2016-06-25 DIAGNOSIS — I480 Paroxysmal atrial fibrillation: Secondary | ICD-10-CM | POA: Diagnosis not present

## 2016-06-25 DIAGNOSIS — A419 Sepsis, unspecified organism: Secondary | ICD-10-CM | POA: Diagnosis not present

## 2016-06-25 DIAGNOSIS — K5732 Diverticulitis of large intestine without perforation or abscess without bleeding: Secondary | ICD-10-CM | POA: Diagnosis not present

## 2016-06-25 LAB — URINE CULTURE: Culture: >=100000 — AB

## 2016-06-25 MED ORDER — METOPROLOL SUCCINATE ER 25 MG PO TB24
25.0000 mg | ORAL_TABLET | Freq: Every day | ORAL | Status: DC
  Administered 2016-06-25: 25 mg via ORAL
  Filled 2016-06-25: qty 1

## 2016-06-25 MED ORDER — METRONIDAZOLE 500 MG PO TABS: 500.0000 mg | ORAL_TABLET | Freq: Three times a day (TID) | ORAL | 0 refills | Status: DC

## 2016-06-25 MED ORDER — CIPROFLOXACIN HCL 500 MG PO TABS: 500.0000 mg | ORAL_TABLET | Freq: Two times a day (BID) | ORAL | 0 refills | Status: DC

## 2016-06-25 MED ORDER — METRONIDAZOLE 500 MG PO TABS: 500.0000 mg | ORAL_TABLET | Freq: Two times a day (BID) | ORAL | 0 refills | Status: DC

## 2016-06-25 NOTE — Telephone Encounter (Signed)
Called, spoke with pt. Informed pt Dr. Lovena Le recommend pt to start back on Metoprolol once discharged. Pt verbalized understanding and agreed with plan.

## 2016-06-25 NOTE — Progress Notes (Signed)
Avriel P Paugh to be D/C'd Home per MD order.  Discussed with the patient and all questions fully answered.  VSS, Skin clean, dry and intact without evidence of skin break down, no evidence of skin tears noted. IV catheter discontinued intact. Site without signs and symptoms of complications. Dressing and pressure applied.  An After Visit Summary was printed and given to the patient. Patient received prescription.  D/c education completed with patient/family including follow up instructions, medication list, d/c activities limitations if indicated, with other d/c instructions as indicated by MD - patient able to verbalize understanding, all questions fully answered.   Patient instructed to return to ED, call 911, or call MD for any changes in condition.   Patient escorted via [place], and D/C home via private auto.  Luci Bank 06/25/2016 10:57 AM

## 2016-06-25 NOTE — Discharge Summary (Addendum)
Physician Discharge Summary  Gwendolyn Alvarado [id number] DOB: 10/03/1945 DOA: 06/23/2016  PCP: Gara Kroner, MD  Admit date: 06/23/2016 Discharge date: 06/25/2016  Admitted From: home Disposition:  Home  Recommendations for Outpatient Follow-up:  1. Follow up with PCP in 1-2 weeks 2. Please obtain BMP/CBC in one week   Home Health:No Equipment/Devices:none  Discharge Condition:Stable CODE STATUS:Full Diet recommendation: Heart Healthy  Brief/Interim Summary: 70 y.o. female with a history of diverticulosis, rhythm controlled atrial fibrillation, and angioma cavernosum. Patient seen for 2 days of worsening abdominal pain that is worse in the left lower quadrant. Patient has had some nausea but denies fevers, chills, vomiting  Discharge Diagnoses:  Principal Problem:   Hypotension Active Problems:   Essential hypertension   PAROXYSMAL ATRIAL FIBRILLATION   Diverticulitis of large intestine without perforation or abscess without bleeding   Sepsis ([place])  Sepsis due to Diverticulitis of large intestine without perforation or abscess without bleeding; Started on the sepsis protocol. He was started empirically on IV Cipro and Flagyl. Has remained afebrile leukocytosis has resolved. She tolerated her diet and antibiotics change to oral. She will cont for 12 additional days.  Hypotension: Resolved, continue IV fluid hydration.  Essential hypertension No changes made antihypertensive medication hels for 2 days, cha be resume as an outpatient.  PAROXYSMAL ATRIAL FIBRILLATION Now on Flecanaide, remains sinus rhythm  Discharge Instructions  Discharge Instructions    Diet - low sodium heart healthy    Complete by:  As directed    Increase activity slowly    Complete by:  As directed        Medication List    TAKE these medications   ciprofloxacin 500 MG tablet Commonly known as:  CIPRO Take 1 tablet (500 mg total) by mouth 2 (two) times daily.   flecainide 50 MG  tablet Commonly known as:  TAMBOCOR TAKE 1 TABLET(50 MG) BY MOUTH TWICE DAILY   metoprolol succinate 25 MG 24 hr tablet Commonly known as:  TOPROL-XL Take 1 tablet (25 mg total) by mouth daily.   metroNIDAZOLE 500 MG tablet Commonly known as:  FLAGYL Take 1 tablet (500 mg total) by mouth 3 (three) times daily.   ondansetron 4 MG tablet Commonly known as:  ZOFRAN Take 1 tablet (4 mg total) by mouth every 6 (six) hours.   VIMPAT 150 MG Tabs Generic drug:  Lacosamide Take 1 tablet by mouth 2 (two) times daily.      Follow-up Information    Gara Kroner, MD. Schedule an appointment as soon as possible for a visit on 07/01/2016.   Specialty:  Family Medicine Why:  Appointment with Dr. Moreen Fowler is on 07/01/16 at 12:15pm Contact information: [place] [address] [phone]        Scarlette Shorts, MD In 3 weeks.   Specialty:  Gastroenterology Why:  hospital follow up Contact information: [address] [place] [address] [phone]          Allergies  Allergen Reactions  . Carbamazepine Hives and Other (See Comments)    Blisters similar to [employer] Syndrome  . Dilantin [Phenytoin Sodium Extended] Hives and Other (See Comments)    Blisters similar to Remo Lipps Johnston's Syndrome Blisters  . Doxycycline Nausea And Vomiting    Made her sick for about 6 weeks & lost 30 lbs  . Hydrocodone-Acetaminophen Nausea And Vomiting  . Penicillins Itching and Rash    Consultations:  None   Procedures/Studies: Dg Chest 2 View  Result  Date: 06/23/2016 CLINICAL DATA:  Lower abdominal pain with cough and chills, onset this morning. EXAM: CHEST  2 VIEW COMPARISON:  11/17/2010 FINDINGS: The lungs are clear. The pulmonary vasculature is normal. Heart size is normal. Hilar and mediastinal contours are unremarkable. There is no pleural effusion. IMPRESSION: No active cardiopulmonary disease. Electronically Signed   By: Andreas Newport M.D.    On: 06/23/2016 04:35   Ct Head Wo Contrast  Result Date: 06/23/2016 CLINICAL DATA:  Headache, fever. EXAM: CT HEAD WITHOUT CONTRAST TECHNIQUE: Contiguous axial images were obtained from the base of the skull through the vertex without intravenous contrast. COMPARISON:  08/30/2013 FINDINGS: Brain: 1.5 cm hyperdense area noted in the medial left temporal lobe is stable, shown on prior imaging to represent a cavernous angioma no hemorrhage, hydrocephalus or acute infarction. No mass effect or midline shift. Vascular: No hyperdense vessel or unexpected calcification. Skull: No acute calvarial abnormality. Sinuses/Orbits: Visualized paranasal sinuses and mastoids clear. Orbital soft tissues unremarkable. 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 No acute intracranial abnormality. Electronically Signed   By: Rolm Baptise M.D.   On: 06/23/2016 07:53   Ct Abdomen Pelvis W Contrast  Result Date: 06/23/2016 CLINICAL DATA:  Low abdominal pain.  Back pain.  Leukocytosis. EXAM: CT ABDOMEN AND PELVIS WITH CONTRAST TECHNIQUE: Multidetector CT imaging of the abdomen and pelvis was performed using the standard protocol following bolus administration of intravenous contrast. CONTRAST:  152mL ISOVUE-300 IOPAMIDOL (ISOVUE-300) INJECTION 61% COMPARISON:  07/02/2006 FINDINGS: Lower chest:  Calcified granuloma in the right lower lobe. Hepatobiliary: No focal liver abnormality.Cholecystectomy with normal common bile duct diameter. Pancreas: Unremarkable. Spleen: Unremarkable. Adrenals/Urinary Tract: Negative adrenals. Two left renal calculi measuring up to 3 mm. Simple right renal cysts. Exophytic mass from the lower pole right kidney measuring 22 mm, with internal fat component, diminished in size from prior. No hydronephrosis or ureteral calculus. Negative urinary bladder. Stomach/Bowel: Extensive colonic diverticulosis with active inflammation at a proximal sigmoid diverticulum. No free  perforation or abscess. No bowel obstruction. No pericecal inflammation. Vascular/Lymphatic: No acute vascular finding. No mass or adenopathy. Reproductive:Hysterectomy. Other: No ascites or pneumoperitoneum. Musculoskeletal: No acute or aggressive finding. T10 vertebral body hemangioma, incidental. Lumbar disc and facet degeneration. IMPRESSION: 1. Sigmoid diverticulitis without abscess or free perforation. 2.  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 3. Left nephrolithiasis. Electronically Signed   By: Monte Fantasia M.D.   On: 06/23/2016 07:57      Subjective: No complains tolerating diet.  Discharge Exam: Vitals:   06/25/16 0521 06/25/16 1038  BP: (!) 115/52 (!) 144/71  Pulse: 74 80  Resp: 20   Temp: 98.9 F (37.2 C)    Vitals:   06/24/16 1553 06/24/16 2141 06/25/16 0521 06/25/16 1038  BP: (!) 123/48 (!) 121/52 (!) 115/52 (!) 144/71  Pulse: 78 82 74 80  Resp: 20 (!) 21 20   Temp: 98.3 F (36.8 C) 99.4 F (37.4 C) 98.9 F (37.2 C)   TempSrc: Oral Oral Oral   SpO2: 99% 97% 97%   Weight:      Height:        General: Pt is alert, awake, not in acute distress Cardiovascular: RRR, S1/S2 +, no rubs, no gallops Respiratory: CTA bilaterally, no wheezing, no rhonchi Abdominal: Soft, NT, ND, bowel sounds + Extremities: no edema, no cyanosis    The results of significant diagnostics from this hospitalization (including imaging, microbiology, ancillary and laboratory) are listed below for reference.     Microbiology:  Recent Results (from the past 240 hour(s))  Urine culture     Status: Abnormal   Collection Time: 06/23/16 12:30 AM  Result Value Ref Range Status   Specimen Description URINE, RANDOM  Final   Special Requests NONE  Final   Culture >=100,000 COLONIES/mL ESCHERICHIA COLI (A)  Final   Report Status 06/25/2016 FINAL  Final   Organism ID, Bacteria ESCHERICHIA COLI (A)  Final      Susceptibility   Escherichia coli - MIC*    AMPICILLIN 8 SENSITIVE Sensitive      CEFAZOLIN <=4 SENSITIVE Sensitive     CEFTRIAXONE <=1 SENSITIVE Sensitive     CIPROFLOXACIN <=0.25 SENSITIVE Sensitive     GENTAMICIN <=1 SENSITIVE Sensitive     IMIPENEM <=0.25 SENSITIVE Sensitive     NITROFURANTOIN <=16 SENSITIVE Sensitive     TRIMETH/SULFA <=20 SENSITIVE Sensitive     AMPICILLIN/SULBACTAM <=2 SENSITIVE Sensitive     PIP/TAZO <=4 SENSITIVE Sensitive     Extended ESBL NEGATIVE Sensitive     * >=100,000 COLONIES/mL ESCHERICHIA COLI     Labs: BNP (last 3 results) No results for input(s): BNP in the last 8760 hours. Basic Metabolic Panel:  Recent Labs Lab 06/23/16 0030  NA 137  K 4.2  CL 104  CO2 23  GLUCOSE 109*  BUN 10  CREATININE 0.74  CALCIUM 9.0   Liver Function Tests:  Recent Labs Lab 06/23/16 0030  AST 38  ALT 40  ALKPHOS 82  BILITOT 1.7*  PROT 6.8  ALBUMIN 3.8    Recent Labs Lab 06/23/16 0030  LIPASE 31   No results for input(s): AMMONIA in the last 168 hours. CBC:  Recent Labs Lab 06/23/16 0030 06/24/16 0506  WBC 13.1* 7.0  HGB 14.0 11.2*  HCT 40.9 33.9*  MCV 91.9 92.6  PLT 232 198   Cardiac Enzymes: No results for input(s): CKTOTAL, CKMB, CKMBINDEX, TROPONINI in the last 168 hours. BNP: Invalid input(s): POCBNP CBG: No results for input(s): GLUCAP in the last 168 hours. D-Dimer No results for input(s): DDIMER in the last 72 hours. Hgb A1c No results for input(s): HGBA1C in the last 72 hours. Lipid Profile No results for input(s): CHOL, HDL, LDLCALC, TRIG, CHOLHDL, LDLDIRECT in the last 72 hours. Thyroid function studies No results for input(s): TSH, T4TOTAL, T3FREE, THYROIDAB in the last 72 hours.  Invalid input(s): FREET3 Anemia work up No results for input(s): VITAMINB12, FOLATE, FERRITIN, TIBC, IRON, RETICCTPCT in the last 72 hours. Urinalysis    Component Value Date/Time   COLORURINE AMBER (A) 06/23/2016 0030   APPEARANCEUR CLEAR 06/23/2016 0030   LABSPEC 1.017 06/23/2016 0030   PHURINE 6.0 06/23/2016  0030   GLUCOSEU NEGATIVE 06/23/2016 0030   HGBUR TRACE (A) 06/23/2016 0030   BILIRUBINUR NEGATIVE 06/23/2016 0030   KETONESUR 40 (A) 06/23/2016 0030   PROTEINUR NEGATIVE 06/23/2016 0030   NITRITE NEGATIVE 06/23/2016 0030   LEUKOCYTESUR NEGATIVE 06/23/2016 0030   Sepsis Labs Invalid input(s): PROCALCITONIN,  WBC,  LACTICIDVEN Microbiology Recent Results (from the past 240 hour(s))  Urine culture     Status: Abnormal   Collection Time: 06/23/16 12:30 AM  Result Value Ref Range Status   Specimen Description URINE, RANDOM  Final   Special Requests NONE  Final   Culture >=100,000 COLONIES/mL ESCHERICHIA COLI (A)  Final   Report Status 06/25/2016 FINAL  Final   Organism ID, Bacteria ESCHERICHIA COLI (A)  Final      Susceptibility   Escherichia coli - MIC*  AMPICILLIN 8 SENSITIVE Sensitive     CEFAZOLIN <=4 SENSITIVE Sensitive     CEFTRIAXONE <=1 SENSITIVE Sensitive     CIPROFLOXACIN <=0.25 SENSITIVE Sensitive     GENTAMICIN <=1 SENSITIVE Sensitive     IMIPENEM <=0.25 SENSITIVE Sensitive     NITROFURANTOIN <=16 SENSITIVE Sensitive     TRIMETH/SULFA <=20 SENSITIVE Sensitive     AMPICILLIN/SULBACTAM <=2 SENSITIVE Sensitive     PIP/TAZO <=4 SENSITIVE Sensitive     Extended ESBL NEGATIVE Sensitive     * >=100,000 COLONIES/mL ESCHERICHIA COLI     Time coordinating discharge: Over 30 minutes  SIGNED:   Charlynne Cousins, MD  [facility] 06/25/2016, 1:12 PM Pager   If 7PM-7AM, please contact night-coverage [email or web address] Password [email or web address]

## 2016-09-03 ENCOUNTER — Ambulatory Visit
Admission: RE | Admit: 2016-09-03 | Discharge: 2016-09-03 | Disposition: A | Discharge status: Home / Self Care | Payer: Medicare Other | Source: Ambulatory Visit | Attending: Family Medicine | Admitting: Family Medicine

## 2016-09-03 ENCOUNTER — Other Ambulatory Visit: Payer: Self-pay | Admitting: Family Medicine

## 2016-09-03 DIAGNOSIS — J209 Acute bronchitis, unspecified: Secondary | ICD-10-CM

## 2017-03-06 ENCOUNTER — Other Ambulatory Visit: Payer: Self-pay | Admitting: Internal Medicine

## 2017-03-19 ENCOUNTER — Encounter: Payer: Self-pay | Admitting: Internal Medicine

## 2017-03-27 DIAGNOSIS — M25572 Pain in left ankle and joints of left foot: Secondary | ICD-10-CM | POA: Insufficient documentation

## 2017-04-10 ENCOUNTER — Encounter: Payer: Self-pay | Admitting: Internal Medicine

## 2017-04-10 ENCOUNTER — Ambulatory Visit (INDEPENDENT_AMBULATORY_CARE_PROVIDER_SITE_OTHER): Payer: Medicare Other | Admitting: Internal Medicine

## 2017-04-10 VITALS — BP 110/74 | HR 77 | Ht 66.5 in | Wt 152.6 lb

## 2017-04-10 DIAGNOSIS — I10 Essential (primary) hypertension: Secondary | ICD-10-CM | POA: Diagnosis not present

## 2017-04-10 DIAGNOSIS — I480 Paroxysmal atrial fibrillation: Secondary | ICD-10-CM | POA: Diagnosis not present

## 2017-04-10 MED ORDER — METOPROLOL SUCCINATE ER 25 MG PO TB24: 25.0000 mg | ORAL_TABLET | Freq: Every day | ORAL | 3 refills | Status: DC

## 2017-04-10 MED ORDER — FLECAINIDE ACETATE 50 MG PO TABS: ORAL_TABLET | ORAL | 3 refills | Status: DC

## 2017-04-10 NOTE — Progress Notes (Signed)
HPI Gwendolyn Alvarado returns today for follow-up. She is a 71 year old woman with paroxysmal atrial fibrillation who has been nicely controlled with flecainide and beta blocker therapy. She was on vacation in [place] when she fell and broke her neck. After returning to [facility] she underwent cervical spine surgery and is now in a neck brace. She remains on systemic anticoagulation for her atrial fibrillation but has inquired about the watchman procedure. She denies chest pain or shortness of breath. She has had no symptomatic atrial fibrillation on medical therapy. Allergies  Allergen Reactions  . Carbamazepine Hives and Other (See Comments)    Blisters similar to [employer] Syndrome  . Dilantin [Phenytoin Sodium Extended] Hives and Other (See Comments)    Blisters similar to Remo Lipps Johnston's Syndrome Blisters  . Doxycycline Nausea And Vomiting    Made her sick for about 6 weeks & lost 30 lbs  . Hydrocodone-Acetaminophen Nausea And Vomiting  . Phenytoin Sodium   . Penicillins Itching and Rash     Current Outpatient Prescriptions  Medication Sig Dispense Refill  . flecainide (TAMBOCOR) 50 MG tablet TAKE 1 TABLET(50 MG) BY MOUTH TWICE DAILY 180 tablet 3  . Lacosamide (VIMPAT) 150 MG TABS Take 1 tablet by mouth 2 (two) times daily.      . metoprolol succinate (TOPROL-XL) 25 MG 24 hr tablet Take 1 tablet (25 mg total) by mouth daily. 90 tablet 3   No current facility-administered medications for this visit.      Past Medical History:  Diagnosis Date  . Angioma cavernosum   . Atrial fibrillation ([place])   . HTN (hypertension)   . Irritable bowel syndrome (IBS)   . Seizure disorder ([place])     ROS:   All systems reviewed and negative except as noted in the HPI.   Past Surgical History:  Procedure Laterality Date  . ABDOMINAL HYSTERECTOMY    . Bartholins gland cyst resection    . benign breast biopsy    . CHOLECYSTECTOMY    . SALIVARY GLAND SURGERY       Family  History  Problem Relation Age of Onset  . Stroke Mother   . Lung cancer Mother   . Seizures Neg Hx      Social History   Social History  . Marital status: Married    Spouse name: N/A  . Number of children: 3  . Years of education: N/A   Occupational History  . Not on file.   Social History Main Topics  . Smoking status: Never Smoker  . Smokeless tobacco: Never Used  . Alcohol use No  . Drug use: No  . Sexual activity: Not on file   Other Topics Concern  . Not on file   Social History Narrative  . No narrative on file     BP 110/74   Pulse 77   Ht 5' 6.5" (1.689 m)   Wt 152 lb 9.6 oz (69.2 kg)   BMI 24.26 kg/m   Physical Exam:  Well appearing NAD, wearing a cervical collar. HEENT: Unremarkable Neck:  Unable to assess JVD, no thyromegally Lymphatics:  No adenopathy Back:  No CVA tenderness Lungs:  Clear, with no wheezes, rales, or rhonchi HEART:  Regular rate rhythm, no murmurs, no rubs, no clicks Abd:  soft, positive bowel sounds, no organomegally, no rebound, no guarding Ext:  2 plus pulses, no edema, no cyanosis, no clubbing Skin:  No rashes no nodules Neuro:  CN II through XII intact, motor  grossly intact  EKG - normal sinus rhythm with QRS duration 128 ms    Assess/Plan: 1. Paroxysmal atrial fibrillation - the patient will continue flecainide which has done a nice job controlling her atrial fib, and beta blocker therapy. We discussed the watchman procedure in detail. We decided that for now, this did not make sense to pursue. She is not on systemic oral anticoagulation secondary to known cavernous hemangioma. I spent 20 minutes with the patient discussing her treatment and options for therapy.  Cristopher Peru, M.D.

## 2017-04-10 NOTE — Patient Instructions (Signed)

## 2017-04-23 DIAGNOSIS — Z9889 Other specified postprocedural states: Secondary | ICD-10-CM | POA: Insufficient documentation

## 2017-10-20 ENCOUNTER — Telehealth: Payer: Self-pay | Admitting: Internal Medicine

## 2017-10-20 NOTE — Telephone Encounter (Signed)
New Message:   Pt has questions about Altitude Medication for a trip she is going on April 25th and would like to see if she can get medication and  If it is okay to take with heart medication. Pt states primary care doctor said she should asks heart doctor about medicine.

## 2017-10-21 NOTE — Telephone Encounter (Signed)
Returned call to Pt.  OK to take diomax with flecainide for altitude.  Pt indicates understanding.

## 2017-11-10 ENCOUNTER — Ambulatory Visit (INDEPENDENT_AMBULATORY_CARE_PROVIDER_SITE_OTHER): Payer: Medicare Other | Admitting: Family Medicine

## 2017-11-10 ENCOUNTER — Encounter: Payer: Self-pay | Admitting: Family Medicine

## 2017-11-10 VITALS — BP 130/78 | HR 68 | Temp 98.6°F | Resp 16 | Ht 66.0 in | Wt 156.0 lb

## 2017-11-10 DIAGNOSIS — K5730 Diverticulosis of large intestine without perforation or abscess without bleeding: Secondary | ICD-10-CM

## 2017-11-10 DIAGNOSIS — R1031 Right lower quadrant pain: Secondary | ICD-10-CM

## 2017-11-10 LAB — POCT URINALYSIS DIPSTICK
Appearance: NORMAL
Bilirubin, UA: NEGATIVE
Glucose, UA: NEGATIVE
Ketones, UA: NEGATIVE
Leukocytes, UA: NEGATIVE
Nitrite, UA: NEGATIVE
Odor: NORMAL
Protein, UA: NEGATIVE
Spec Grav, UA: 1.015 (ref 1.010–1.025)
Urobilinogen, UA: 0.2 E.U./dL
pH, UA: 5.5 (ref 5.0–8.0)

## 2017-11-10 MED ORDER — METRONIDAZOLE 500 MG PO TABS: 500.0000 mg | ORAL_TABLET | Freq: Two times a day (BID) | ORAL | 0 refills | Status: DC

## 2017-11-10 MED ORDER — CIPROFLOXACIN HCL 500 MG PO TABS: 500.0000 mg | ORAL_TABLET | Freq: Two times a day (BID) | ORAL | 0 refills | Status: DC

## 2017-11-10 NOTE — Patient Instructions (Signed)
Take the antibiotics a directed [place] diet Drink lots of water  Call for any problems or questions

## 2017-11-10 NOTE — Progress Notes (Signed)
Chief Complaint  Patient presents with  . Abdominal Pain    lower abdominal pain and tenderness for the past few days. Feels like it did before when she had diverticulitis Just feels sickly    Mrs. Gaulin is here for an acute visit.  She is having abdominal pain.  She was unable to get in with the usual PCP. She has a history of diverticulosis, and acute diverticulitis in 2017.  She states the pain she is having now is reminiscent of her diverticulitis pain. She has decreased appetite but no nausea or vomiting.  She has had no diarrhea or loose bowels.  No blood or mucus in the bowels.  Her last bowel movement was this morning. No fever or chills.  No myalgia or body aches. She has not eaten anything she thinks might not agree with her She is a history of irritable bowel syndrome.  This feels distinctly different, and more severe than her usual IBS. She has not taken anything for the pain.  Is getting worse as the day progresses.  Patient Active Problem List   Diagnosis Date Noted  . Diverticulitis of large intestine without perforation or abscess without bleeding 06/23/2016  . Other hypertrophic cardiomyopathy ([place]) 11/16/2013  . Hemangioma 02/13/2009  . Essential hypertension 02/13/2009  . PAROXYSMAL ATRIAL FIBRILLATION 02/13/2009    Outpatient Encounter Medications as of 11/10/2017  Medication Sig  . flecainide (TAMBOCOR) 50 MG tablet TAKE 1 TABLET(50 MG) BY MOUTH TWICE DAILY  . Lacosamide (VIMPAT) 150 MG TABS Take 1 tablet by mouth 2 (two) times daily.    . metoprolol succinate (TOPROL-XL) 25 MG 24 hr tablet Take 1 tablet (25 mg total) by mouth daily.  . ciprofloxacin (CIPRO) 500 MG tablet Take 1 tablet (500 mg total) by mouth 2 (two) times daily.  . metroNIDAZOLE (FLAGYL) 500 MG tablet Take 1 tablet (500 mg total) by mouth 2 (two) times daily.   No facility-administered encounter medications on file as of 11/10/2017.     Allergies  Allergen Reactions  . Carbamazepine Hives and  Other (See Comments)    Blisters similar to [employer] Syndrome  . Dilantin [Phenytoin Sodium Extended] Hives and Other (See Comments)    Blisters similar to Remo Lipps Johnston's Syndrome Blisters  . Doxycycline Nausea And Vomiting    Made her sick for about 6 weeks & lost 30 lbs  . Phenytoin Sodium   . Hydrocodone-Acetaminophen Nausea And Vomiting  . Penicillins Itching and Rash    Review of Systems  Constitutional: Positive for appetite change. Negative for activity change and unexpected weight change.  HENT: Negative for congestion, dental problem, postnasal drip and rhinorrhea.   Eyes: Negative for redness and visual disturbance.  Respiratory: Negative for cough and shortness of breath.   Cardiovascular: Negative for chest pain, palpitations and leg swelling.  Gastrointestinal: Positive for abdominal pain. Negative for blood in stool, constipation, diarrhea, nausea and vomiting.  Genitourinary: Negative for difficulty urinating and frequency.       Hysterectomy status  Musculoskeletal: Negative for arthralgias, back pain and myalgias.  Neurological: Negative for dizziness and headaches.       Seizures controlled  Psychiatric/Behavioral: Negative for dysphoric mood and sleep disturbance. The patient is not nervous/anxious.     Physical Exam  Constitutional: She is oriented to person, place, and time. She appears well-developed and well-nourished.  HENT:  Head: Normocephalic and atraumatic.  Right Ear: External ear normal.  Left Ear: External ear normal.  Mouth/Throat: Oropharynx is clear  and moist.  Eyes: Pupils are equal, round, and reactive to light. Conjunctivae are normal.  Neck: Normal range of motion. Neck supple. No thyromegaly present.  Cardiovascular: Normal rate, regular rhythm and normal heart sounds.  Pulmonary/Chest: Effort normal and breath sounds normal. No respiratory distress. She has no rales.  Abdominal: Soft. Bowel sounds are normal. There is no  hepatosplenomegaly. There is tenderness in the right lower quadrant and suprapubic area. There is guarding. There is no rebound.  Musculoskeletal: Normal range of motion. She exhibits no edema.  Lymphadenopathy:    She has no cervical adenopathy.  Neurological: She is alert and oriented to person, place, and time.  Gait normal  Skin: Skin is warm and dry.  Psychiatric: She has a normal mood and affect. Her behavior is normal. Thought content normal.  Nursing note and vitals reviewed.   BP 130/78   Pulse 68   Temp 98.6 F (37 C) (Oral)   Resp 16   Ht 5\' 6"  (1.676 m)   Wt 156 lb (70.8 kg)   SpO2 98%   BMI 25.18 kg/m     ASSESSMENT/PLAN:  1. Right lower quadrant abdominal pain Tenderness in the lower abdomen over the colon.  History of diverticulitis.  Patient does not have any fever, nausea vomiting or diarrhea.  She states the pain is worsening over time.  I would cover her with Cipro and Flagyl.  She understands that if she feels worse instead of better, cannot keep down antibiotics, has a fever worse that she may need to go to emergency room or get a CAT scan. - POCT urinalysis dipstick= negative  2. Diverticulosis of colon History   Patient Instructions  Take the antibiotics a directed Bland diet Drink lots of water  Call for any problems or questions   Raylene Everts, MD

## 2017-11-26 IMAGING — CT CT HEAD W/O CM
3 of 4 series · 15 of 47 positions shown, 18 images · non-contrast
Comparison: 08/30/2013

CLINICAL DATA: Headache, fever.

EXAM:
CT HEAD WITHOUT CONTRAST
TECHNIQUE: Contiguous axial images were obtained from the base of the skull
through the vertex without intravenous contrast.

[Series 3: head 2.0 h70h · axial · 0.42mm/px · z∈[-117,+11]mm · 9 of 80 slices shown, 12 images]
[im 8/80  brain]
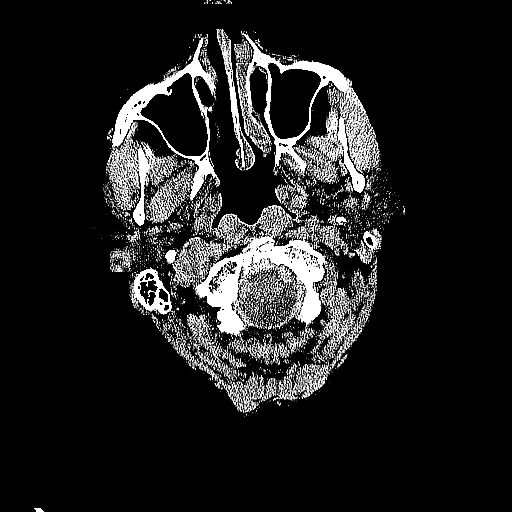
[im 8/80  bone]
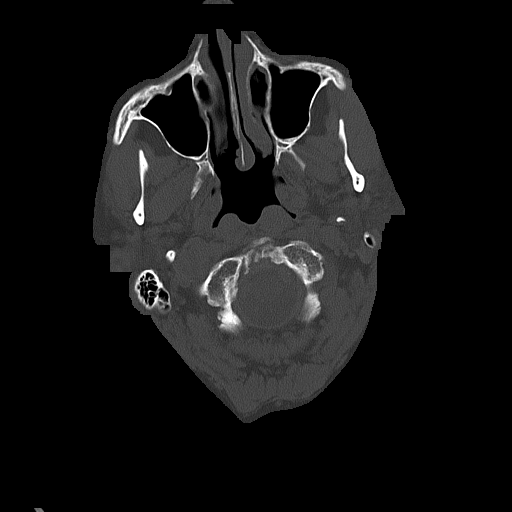
[im 16/80  brain]
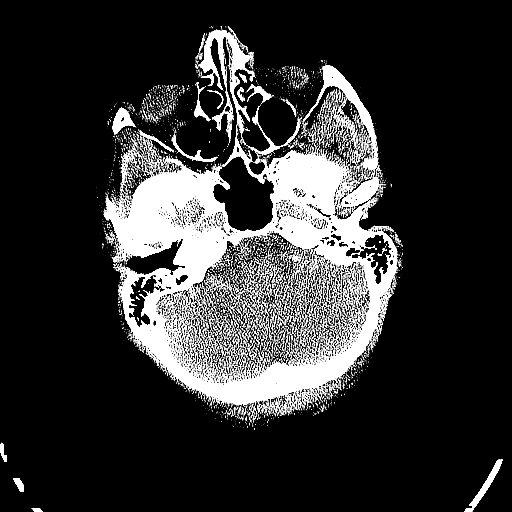
[im 24/80  brain]
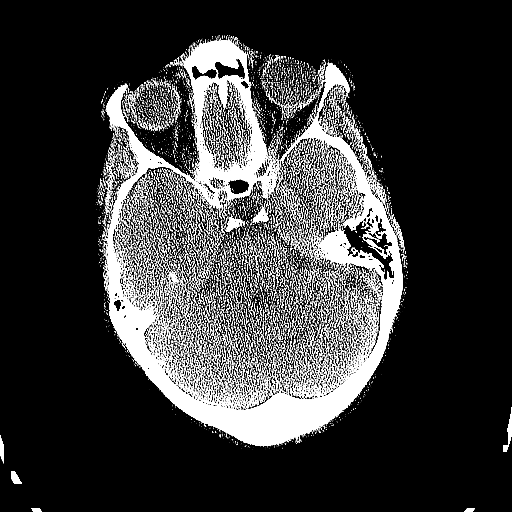
[im 32/80  brain]
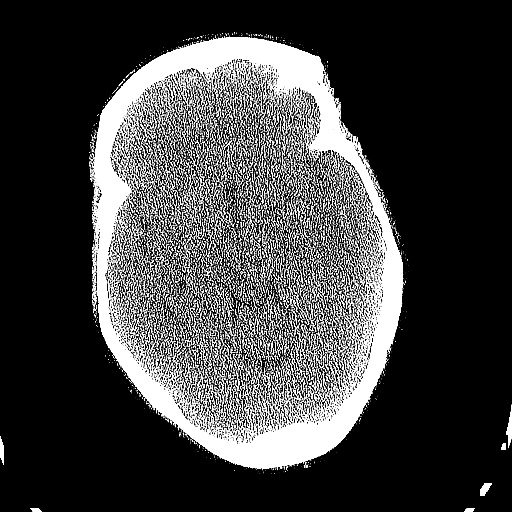
[im 40/80  brain]
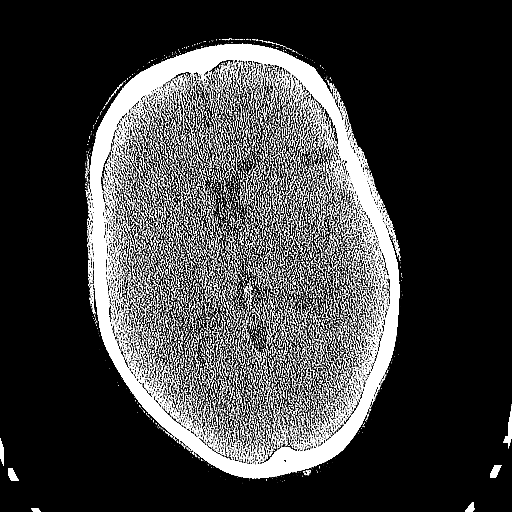
[im 40/80  bone]
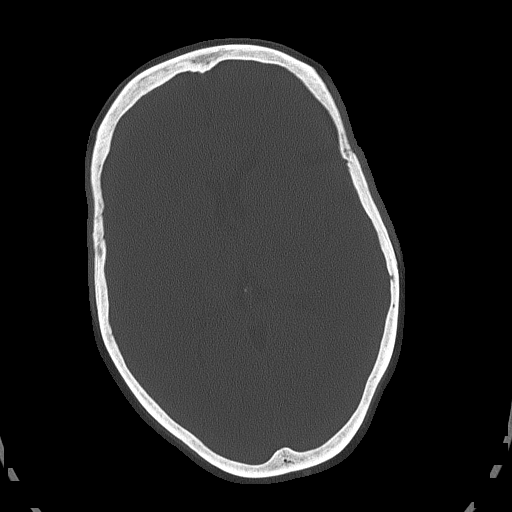
[im 48/80  brain]
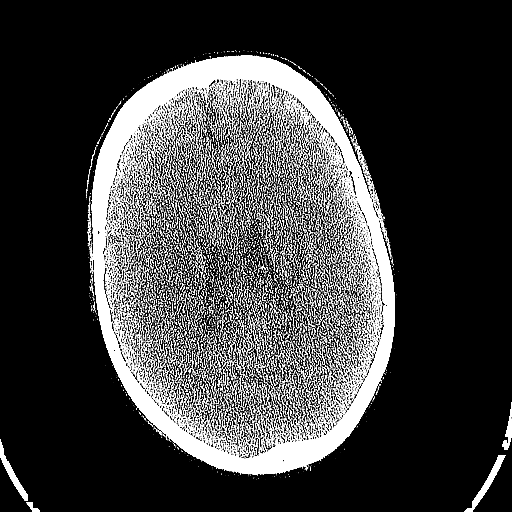
[im 56/80  brain]
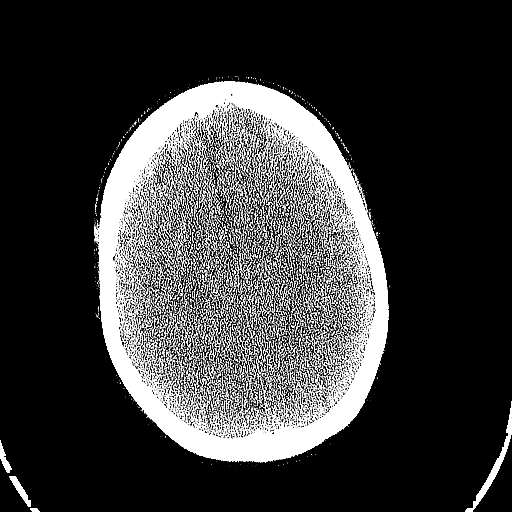
[im 64/80  brain]
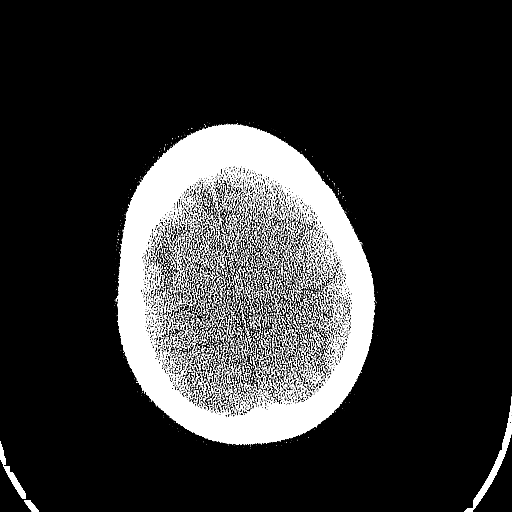
[im 72/80  brain]
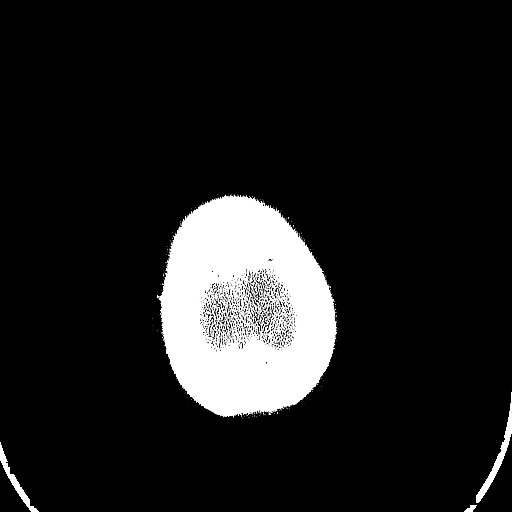
[im 72/80  bone]
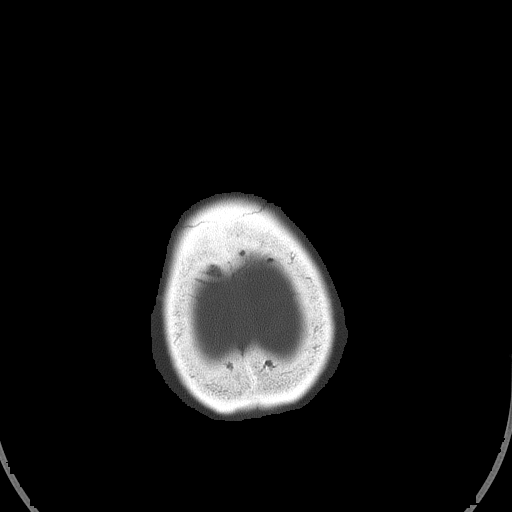

[Series 4: head 3.0 mpr cor · coronal · 0.28mm/px · 3 of 67 slices shown]
[im 23/67  brain]
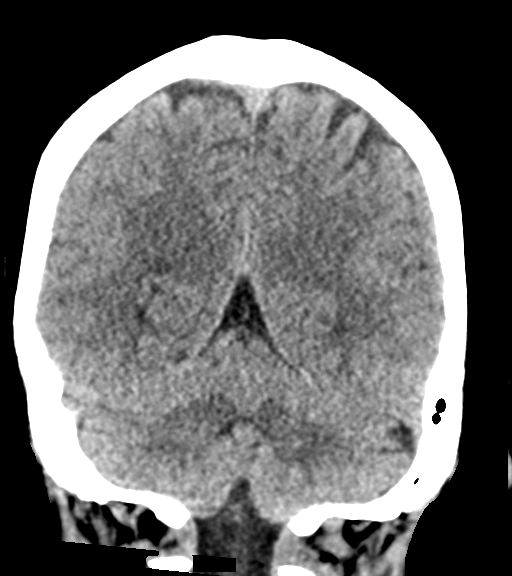
[im 30/67  brain]
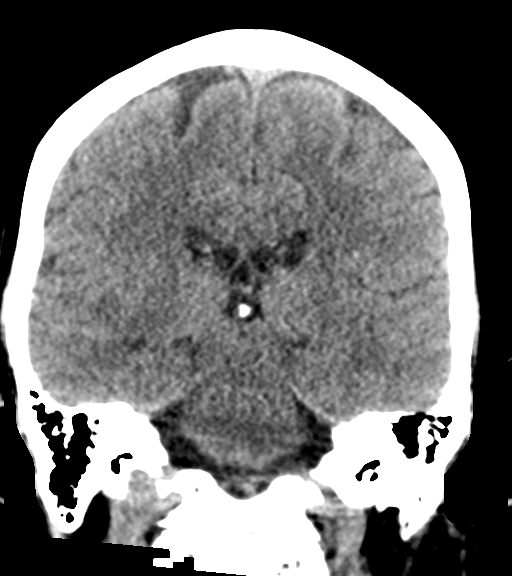
[im 37/67  brain]
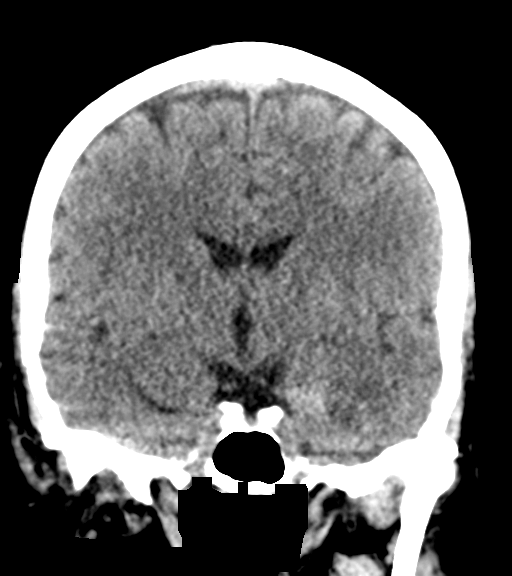

[Series 5: head 3.0 mpr sag · sagittal · 0.31mm/px · 3 of 47 slices shown]
[im 16/47  brain]
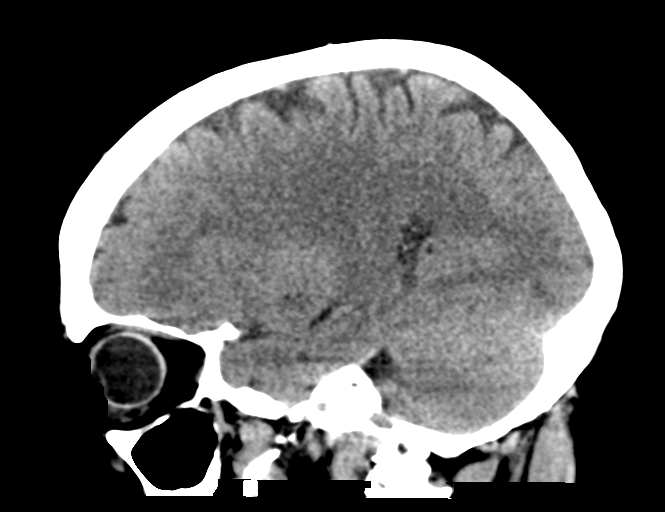
[im 24/47  brain]
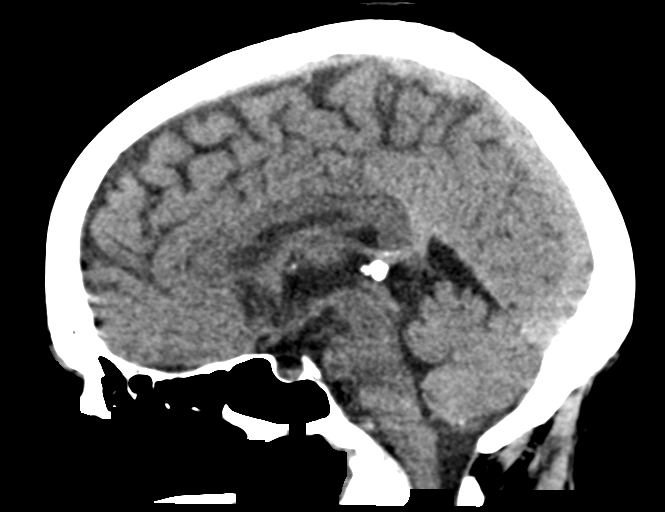
[im 31/47  brain]
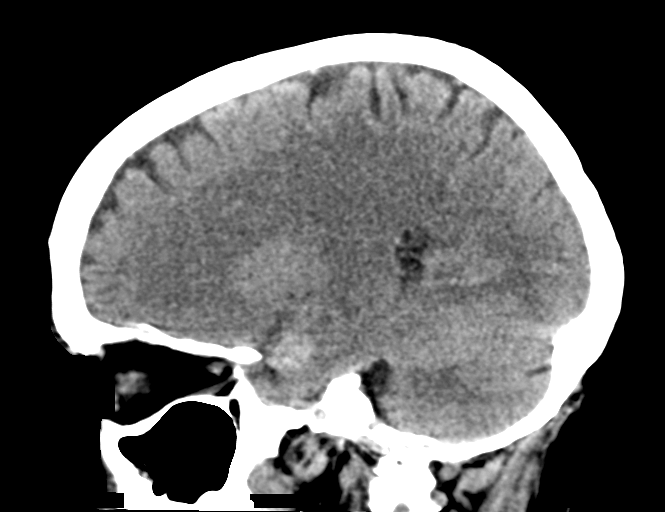

[15 of 47 positions shown; findings below may reference images not displayed]

FINDINGS: Brain: 1.5 cm hyperdense area noted in the medial left temporal lobe
is stable, shown on prior imaging to represent a cavernous angioma
no hemorrhage, hydrocephalus or acute infarction. No mass effect or
midline shift.

Vascular: No hyperdense vessel or unexpected calcification.

Skull: No acute calvarial abnormality.

Sinuses/Orbits: Visualized paranasal sinuses and mastoids clear.
Orbital soft tissues unremarkable.

Other: None
IMPRESSION: Stable cavernous angioma in the medial left temporal lobe.

No acute intracranial abnormality.

## 2017-11-26 IMAGING — CT CT ABD-PELV W/ CM
2 of 5 series · 16 of 46 positions shown, 18 images · IV contrast (APPLIED)
Comparison: 07/02/2006

CLINICAL DATA: Low abdominal pain.  Back pain.  Leukocytosis.

EXAM:
CT ABDOMEN AND PELVIS WITH CONTRAST
TECHNIQUE: Multidetector CT imaging of the abdomen and pelvis was performed
using the standard protocol following bolus administration of
intravenous contrast.
CONTRAST:  100mL 2WLX9M-KNN IOPAMIDOL (2WLX9M-KNN) INJECTION 61%

[Series 2: abd/ pelvis 5.0 i30f 1 · axial · 0.61mm/px · z∈[-839,-414]mm · 13 of 96 slices shown, 15 images]
[im 6/96  soft-tissue]
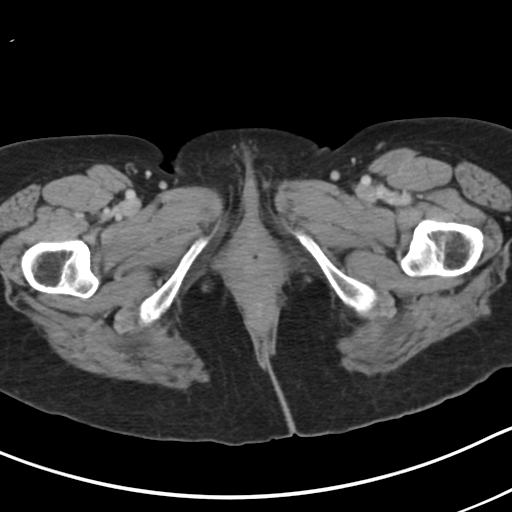
[im 6/96  bone]
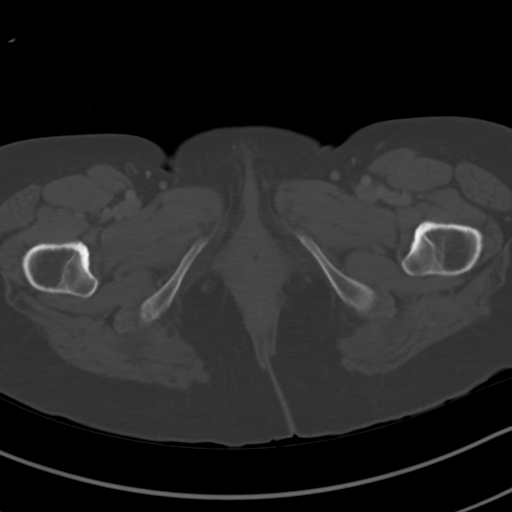
[im 16/96  soft-tissue]
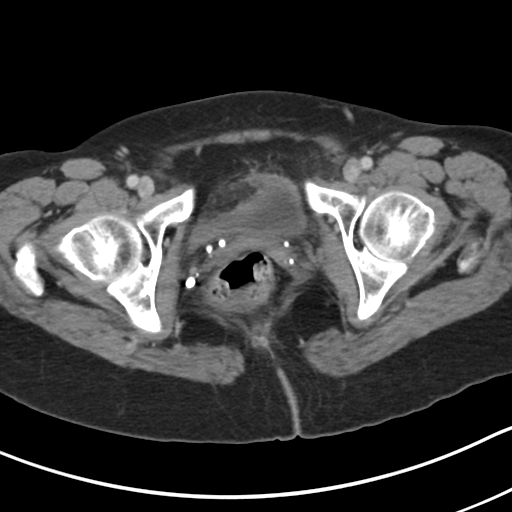
[im 21/96  soft-tissue]
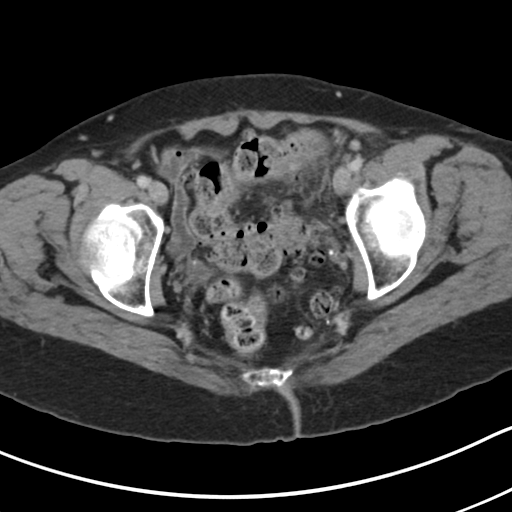
[im 26/96  soft-tissue]
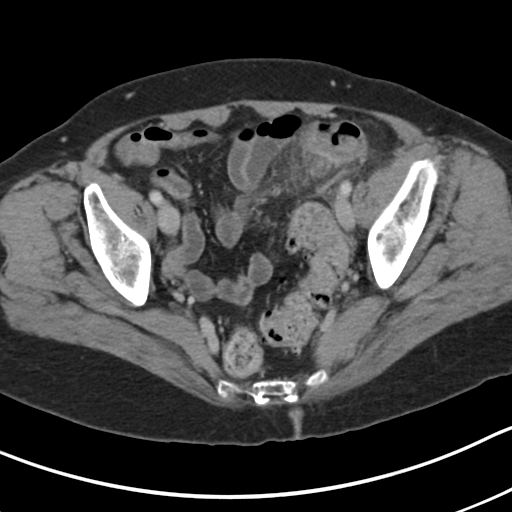
[im 36/96  soft-tissue]
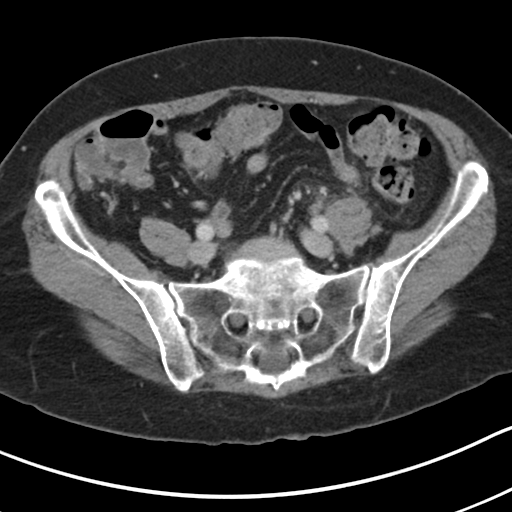
[im 41/96  soft-tissue]
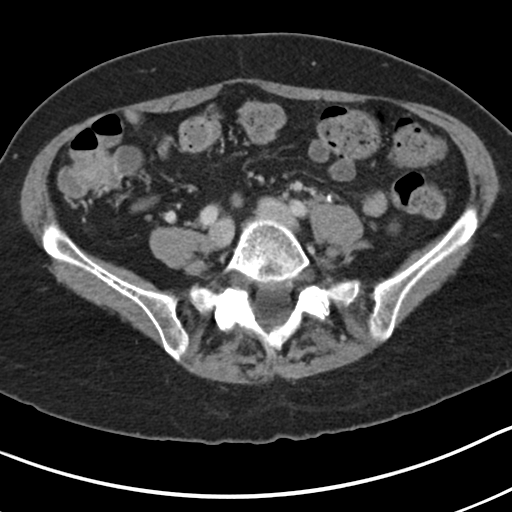
[im 51/96  soft-tissue]
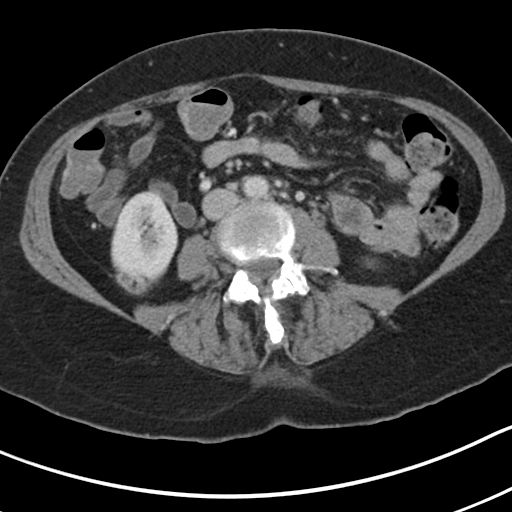
[im 56/96  soft-tissue]
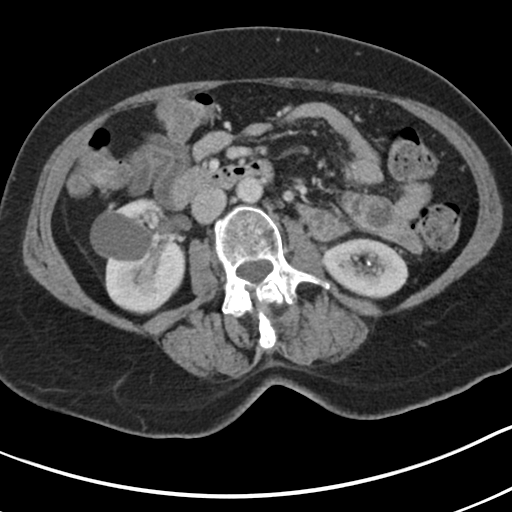
[im 61/96  soft-tissue]
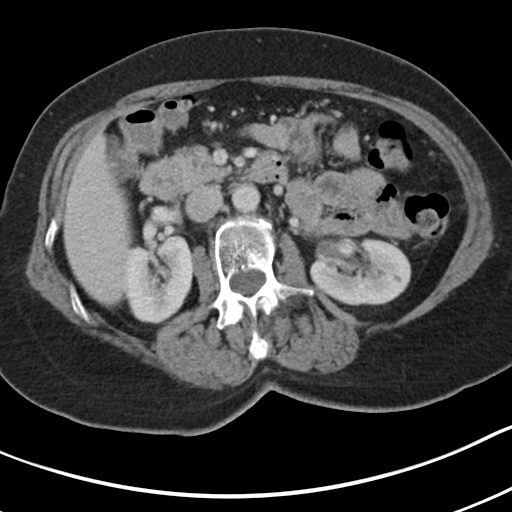
[im 61/96  bone]
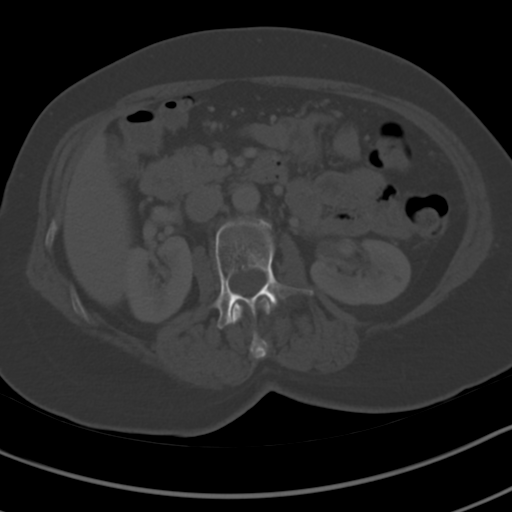
[im 71/96  soft-tissue]
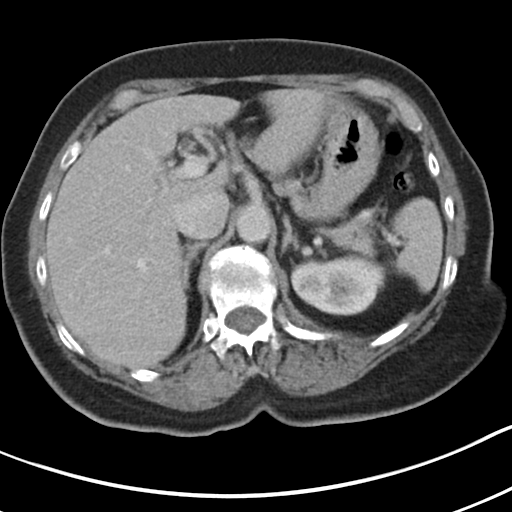
[im 76/96  soft-tissue]
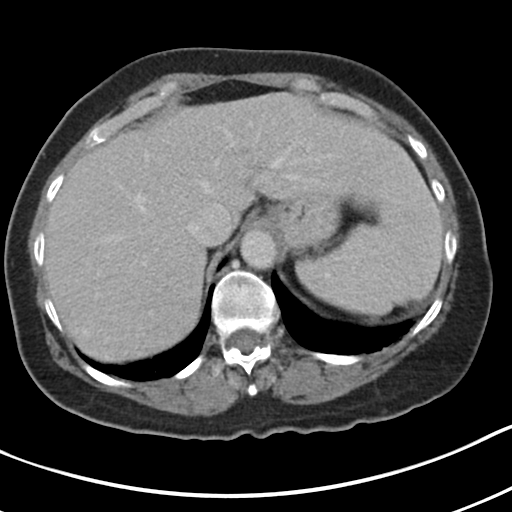
[im 81/96  soft-tissue]
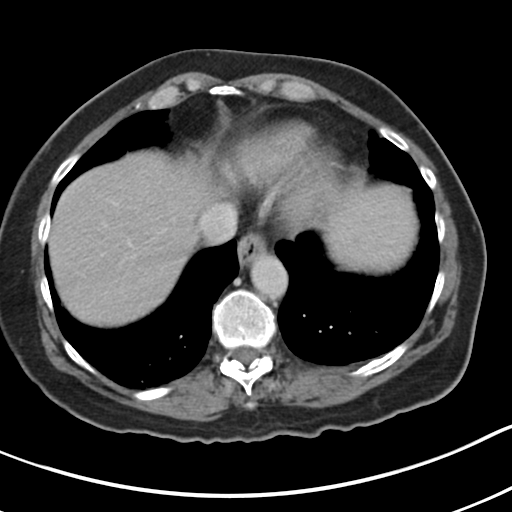
[im 91/96  soft-tissue]
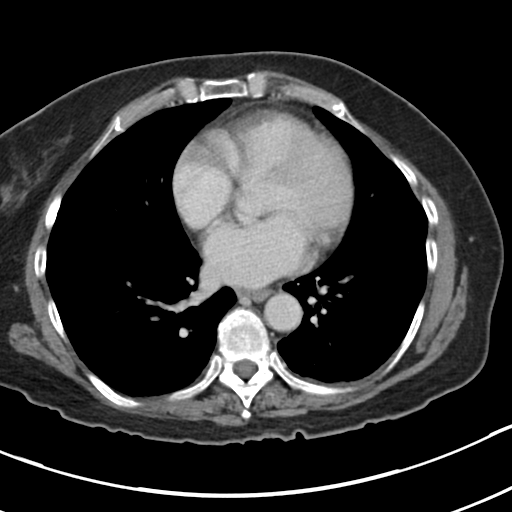

[Series 5: coronal soft tissue · coronal · 0.81mm/px · 3 of 79 slices shown]
[im 27/79  soft-tissue]
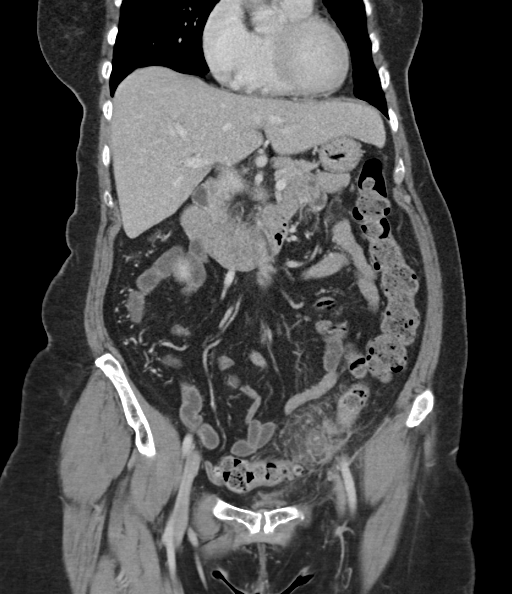
[im 35/79  soft-tissue]
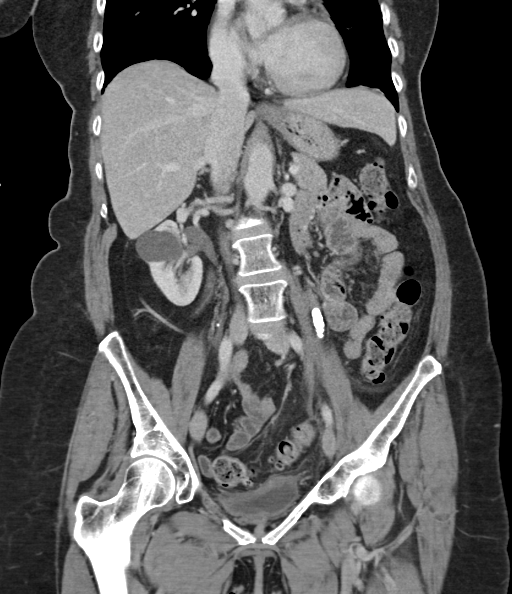
[im 44/79  soft-tissue]
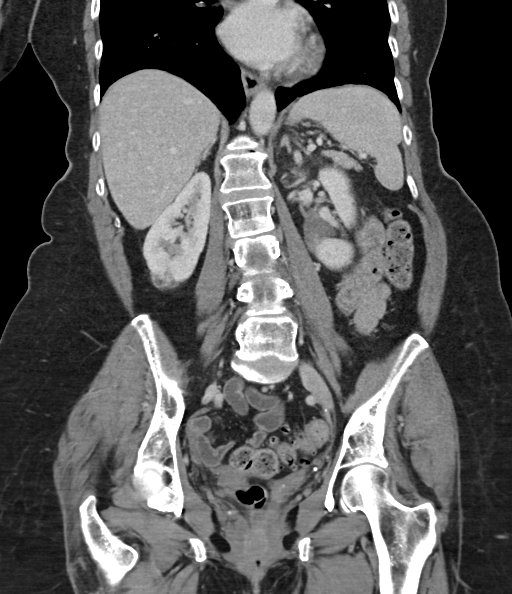

[16 of 46 positions shown; findings below may reference images not displayed]

FINDINGS: Lower chest:  Calcified granuloma in the right lower lobe.

Hepatobiliary: No focal liver abnormality.Cholecystectomy with
normal common bile duct diameter.

Pancreas: Unremarkable.

Spleen: Unremarkable.

Adrenals/Urinary Tract: Negative adrenals. Two left renal calculi
measuring up to 3 mm. Simple right renal cysts. Exophytic mass from
the lower pole right kidney measuring 22 mm, with internal fat
component, diminished in size from prior. No hydronephrosis or
ureteral calculus. Negative urinary bladder.

Stomach/Bowel: Extensive colonic diverticulosis with active
inflammation at a proximal sigmoid diverticulum. No free perforation
or abscess. No bowel obstruction. No pericecal inflammation.

Vascular/Lymphatic: No acute vascular finding. No mass or
adenopathy.

Reproductive:Hysterectomy.

Other: No ascites or pneumoperitoneum.

Musculoskeletal: No acute or aggressive finding. T10 vertebral body
hemangioma, incidental. Lumbar disc and facet degeneration.
IMPRESSION: 1. Sigmoid diverticulitis without abscess or free perforation.
2.  23 mm right renal angiomyolipoma.
3. Left nephrolithiasis.

## 2018-01-22 ENCOUNTER — Ambulatory Visit (INDEPENDENT_AMBULATORY_CARE_PROVIDER_SITE_OTHER): Payer: Medicare Other | Admitting: Internal Medicine

## 2018-01-22 ENCOUNTER — Encounter (INDEPENDENT_AMBULATORY_CARE_PROVIDER_SITE_OTHER): Payer: Self-pay | Admitting: Internal Medicine

## 2018-01-22 VITALS — BP 130/80 | HR 66 | Temp 97.4°F | Resp 18 | Ht 66.0 in | Wt 161.0 lb

## 2018-01-22 DIAGNOSIS — K5732 Diverticulitis of large intestine without perforation or abscess without bleeding: Secondary | ICD-10-CM

## 2018-01-22 DIAGNOSIS — K582 Mixed irritable bowel syndrome: Secondary | ICD-10-CM | POA: Diagnosis not present

## 2018-01-22 MED ORDER — DOCUSATE SODIUM 100 MG PO CAPS: 100.0000 mg | ORAL_CAPSULE | Freq: Two times a day (BID) | ORAL | 0 refills | Status: DC

## 2018-01-22 MED ORDER — METRONIDAZOLE 500 MG PO TABS: 500.0000 mg | ORAL_TABLET | Freq: Two times a day (BID) | ORAL | 0 refills | Status: DC

## 2018-01-22 MED ORDER — CIPROFLOXACIN HCL 500 MG PO TABS
500.0000 mg | ORAL_TABLET | Freq: Two times a day (BID) | ORAL | 0 refills | Status: DC
Start: 2018-01-22 — End: 2018-05-04

## 2018-01-22 NOTE — Patient Instructions (Addendum)
Keep stool diary as to frequency and consistency of stools until office visit in 8 weeks. Stop antibiotics on day 14. Increase intake of fiber rich foods as tolerated. Can use Dulcolax suppository for constipation or if you feel you need to have a bowel movement and unable to do so. Notify if you have symptoms suggestive of diverticulitis. Will request colonoscopy records.

## 2018-01-22 NOTE — Progress Notes (Signed)
Reason for consultation:  Recurrent diverticulitis.  History of present illness:  Patient is 72 year old Caucasian female who was referred through courtesy of Dr. Marijo File for GI evaluation. She has several year history of irritable bowel syndrome with both diarrhea and constipation.  She also has a history of colonic polyps and is up-to-date on her schedule with last colonoscopy by Dr. Chapman Fitch of [facility].  She was in usual state of health until November 2017 when she was going on a cruise with her family.  The morning she left for airport she was having lower abdominal pain which she thought she was having a spell with IBS.  When she landed in [place] her pain became intense and she also had chills.  She returned home and was [facility] with [facility] with sepsis.  She responded to antibiotic therapy which she continued for 2 weeks.  She did fine other than spells of a constipation and diarrhea until April this year when she had another spell and was treated with antibiotics for 10 days and once again she felt better.  Third episode occurred 12 days ago.  She states she had pain across lower abdomen and would have her to walk.  She did not experience fever chills night sweats.  She is feeling better.  She is on day 10 of antibiotic.  She denies melena or rectal bleeding or weight loss.  If anything she has gained few pounds over the last year.  Her appetite has not been good since she has been on antibiotics.  She may take Tylenol occasionally but she does not take other OTC NSAIDs. For her IBS she has tried at least 2 different kinds of probiotics including align but it did not make any difference.  When she is constipated she can go 2 or 3 days without a bowel movement.  She says when she is having spell with IBS she can go from having hard stool to loose stool during same defecation.   Current Medications: Outpatient Encounter Medications as of 01/22/2018   Medication Sig  . ciprofloxacin (CIPRO) 500 MG tablet Take 1 tablet (500 mg total) by mouth 2 (two) times daily.  . flecainide (TAMBOCOR) 50 MG tablet TAKE 1 TABLET(50 MG) BY MOUTH TWICE DAILY  . Lacosamide (VIMPAT) 150 MG TABS Take 1 tablet by mouth 2 (two) times daily.    . metoprolol succinate (TOPROL-XL) 25 MG 24 hr tablet Take 1 tablet (25 mg total) by mouth daily.  . metroNIDAZOLE (FLAGYL) 500 MG tablet Take 1 tablet (500 mg total) by mouth 2 (two) times daily.   No facility-administered encounter medications on file as of 01/22/2018.    Past medical history:  Paroxysmal atrial fibrillation. Irritable bowel syndrome. Osteopenia. 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 Left nephrolithiasis seen on CT of November 2017.  She had 2 stones in each measured 3 mm. History of colonic polyps.  Last colonoscopy in May 2018 revealing colonic diverticulosis.  Hospitalized for diverticulitis with sepsis in November 2017. Salivary gland removal when she was a teenager. Bartholin gland/cyst excision when she was in her 31s. Cholecystectomy in early 90s for cholelithiasis. BSO with hysterectomy in 1996 for benign tumor. C1-2 fusion for fracture resulting from a fall while she was in [place].  She had surgery at [facility].   Allergies: Allergies  Allergen Reactions  . Carbamazepine Hives and Other (See Comments)    Blisters similar to [employer] Syndrome  . Dilantin [Phenytoin  Sodium Extended] Hives and Other (See Comments)    Blisters similar to Remo Lipps Johnston's Syndrome Blisters  . Doxycycline Nausea And Vomiting    Made her sick for about 6 weeks & lost 30 lbs  . Phenytoin Sodium   . Hydrocodone-Acetaminophen Nausea And Vomiting  . Penicillins Itching and Rash     Family history:  Father died of auto accident in his 36s. Mother died of rare type of cancer in her 34s.  No further details available. She has 1 sister age 72 who has a  pacemaker.  Social history:  She is married and retired.  She has 2 daughters.  One of her daughters Dr. Mannie Stabile is a physician.  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 She has never smoked cigarettes and does not drink alcohol. She does not do any scheduled exercise but stays busy.   Physical examination: Blood pressure 130/80, pulse 66, temperature (!) 97.4 F (36.3 C), temperature source Oral, resp. rate 18, height 5\' 6"  (1.676 m), weight 161 lb (73 kg). Patient is alert and in no acute distress. Conjunctiva is pink. Sclera is nonicteric Oropharyngeal mucosa is normal. No neck masses or thyromegaly noted. Cardiac exam with regular rhythm normal S1 and S2. No murmur or gallop noted. Lungs are clear to auscultation. Abdomen is symmetrical.  Bowel sounds are normal.  On palpation abdomen is soft.  She has mild tenderness in right lower quadrant on deep palpation without guarding.  No organomegaly or masses. Rectal examination deferred. No LE edema or clubbing noted.  Labs/studies Results:  Abdominopelvic CT from 06/23/2016 reviewed.  Study performed with contrast. She had sigmoid diverticulitis without abscess or free air left nephrolithiasis and right renal and right renal myolipoma.  Assessment:  Patient is 72 year old Caucasian female who has several year history of irritable bowel syndrome who presents with third episode of what appears to be sigmoid: Diverticulitis.  First episode occurred in November 2017 requiring hospitalization.  Second episode occurred about 2 months ago and now she is on day 10 of antibiotic therapy and doing much better.  She does not have any risk factors such as chronic NSAID use.  Last colonoscopy was in May 2018 revealing diverticulosis.  Although precise etiology of these episodes is not clear but one has to wonder if bowel irregularity is contributing to her illness.  Would try to improve her  bowel habits so that she has regular bowel movements without spells of diarrhea and constipation and see what happens.  If she keeps having recurrent spells she would be a candidate for sigmoid colon resection.  May consider empiric therapy with oral mesalamine if she has another episode of diverticulitis.  History of colonic polyps.  Last colonoscopy was in May 2018 and no polyps were identified.  Recommendations:  Patient advised to increase intake of fiber rich foods as tolerated. Continue Cipro and metronidazole for another 4 days for total of 14 days.  New prescription given for both antibiotics.  She will have 10 days worth of medication left in case she has another episode. Colace 100 mg p.o. twice daily. She can use Dulcolax suppository for constipation.  Patient advised not to take stimulant laxatives. Will request recent colonoscopy records. Patient advised to keep stool diary until office visit in 8 weeks. Will consider repeating her CT if she has another episode to make sure it is a same segment causing her symptoms.

## 2018-01-24 ENCOUNTER — Telehealth (INDEPENDENT_AMBULATORY_CARE_PROVIDER_SITE_OTHER): Payer: Self-pay | Admitting: Internal Medicine

## 2018-01-24 NOTE — Telephone Encounter (Signed)
Talked with patient. She was having pain on the left side yesterday and did not eat much at all. She is afebrile.  She is having normal bowel movements. She has been on antibiotics for 12 to 13 days. Will proceed with abdominal pelvic CT with contrast  Indication is diverticulitis not responding to therapy.

## 2018-01-26 ENCOUNTER — Other Ambulatory Visit (INDEPENDENT_AMBULATORY_CARE_PROVIDER_SITE_OTHER): Payer: Self-pay | Admitting: *Deleted

## 2018-01-26 DIAGNOSIS — R1032 Left lower quadrant pain: Secondary | ICD-10-CM

## 2018-01-26 DIAGNOSIS — K5732 Diverticulitis of large intestine without perforation or abscess without bleeding: Secondary | ICD-10-CM

## 2018-01-26 NOTE — Telephone Encounter (Signed)
CT sch'd 02/13/18 at 400 (345), npo 4 hrs, pick up contrast, left detailed message for patient

## 2018-01-28 ENCOUNTER — Telehealth (INDEPENDENT_AMBULATORY_CARE_PROVIDER_SITE_OTHER): Payer: Self-pay | Admitting: *Deleted

## 2018-01-28 NOTE — Telephone Encounter (Signed)
Patient's daughter, Caren Macadam called in, states Gwendolyn Alvarado has been on antibiotics for a few weeks & had a few days without food and is feeling much better. They feel she doesn't need CT at this time. CT has been canceled.

## 2018-01-29 ENCOUNTER — Ambulatory Visit (HOSPITAL_COMMUNITY): Payer: Medicare Other

## 2018-02-01 NOTE — Telephone Encounter (Signed)
noted 

## 2018-02-02 ENCOUNTER — Other Ambulatory Visit (HOSPITAL_COMMUNITY): Payer: Medicare Other

## 2018-02-13 ENCOUNTER — Ambulatory Visit (HOSPITAL_COMMUNITY): Payer: Medicare Other

## 2018-03-11 ENCOUNTER — Ambulatory Visit (INDEPENDENT_AMBULATORY_CARE_PROVIDER_SITE_OTHER): Payer: Medicare Other | Admitting: Orthopaedic Surgery

## 2018-03-11 ENCOUNTER — Encounter (INDEPENDENT_AMBULATORY_CARE_PROVIDER_SITE_OTHER): Payer: Self-pay | Admitting: Orthopaedic Surgery

## 2018-03-11 ENCOUNTER — Ambulatory Visit (INDEPENDENT_AMBULATORY_CARE_PROVIDER_SITE_OTHER): Payer: Self-pay

## 2018-03-11 VITALS — BP 117/71 | HR 68 | Ht 67.0 in | Wt 160.0 lb

## 2018-03-11 DIAGNOSIS — M25571 Pain in right ankle and joints of right foot: Secondary | ICD-10-CM

## 2018-03-11 NOTE — Progress Notes (Signed)
Office Visit Note   Patient: Gwendolyn Alvarado           Date of Birth: 09/21/1945           MRN: [id number] Visit Date: 03/11/2018              Requested by: Antony Contras, MD [address] [place], [place] [address] PCP: Antony Contras, MD   Assessment & Plan: Visit Diagnoses:  1. Pain in right ankle and joints of right foot     Plan: [name] dropped her laptop on the dorsum of her right foot yesterday.  She has been experiencing some pain in the area of the great toe without numbness or tingling.  X-rays are negative for fracture.  She has a small abrasion which we will clean and cover with Betadine.  She will keep a clean bandage and weight-bear to tolerance.  We will plan to see her back as needed.  I expect that she will have some discomfort for several weeks on a compression injury Follow-Up Instructions: Return if symptoms worsen or fail to improve.   Orders:  Orders Placed This Encounter  Procedures  . XR Foot Complete Right   No orders of the defined types were placed in this encounter.     Procedures: No procedures performed   Clinical Data: No additional findings.   Subjective: Chief Complaint  Patient presents with  . New Patient (Initial Visit)    03/10/18 DROPPED LAPTOP ON  R FOOT HAS SWELLING AND LACARATION  Gwendolyn Alvarado is 72 years old and visited the office for evaluation of right foot pain.  Yesterday she dropped her laptop computer on the dorsum of her right foot.  She is had a small abrasion over the great toe metatarsal phalangeal joint.  She is obviously concerned that she may have a fracture.  She is able to bear weight  HPI  Review of Systems   Objective: Vital Signs: BP 117/71 (BP Location: Left Arm, Patient Position: Sitting, Cuff Size: Normal)   Pulse 68   Ht 5\' 7"  (1.702 m)   Wt 160 lb (72.6 kg)   BMI 25.06 kg/m   Physical Exam  Constitutional: She is oriented to person, place, and time. She appears well-developed and  well-nourished.  HENT:  Mouth/Throat: Oropharynx is clear and moist.  Eyes: Pupils are equal, round, and reactive to light. EOM are normal.  Pulmonary/Chest: Effort normal.  Neurological: She is alert and oriented to person, place, and time.  Skin: Skin is warm and dry.  Psychiatric: She has a normal mood and affect. Her behavior is normal.    Ortho Exam awake alert and oriented x3.  Comfortable sitting.  Examination of the right foot there is some very early ecchymosis in the dorsum of the great toe near the metatarsal phalangeal joint.  There is a very small superficial abrasion.  There is no active bleeding.  Normal sensibility.  No swelling of the toe.  Crepitation.  X-rays are negative for fracture  Specialty Comments:  No specialty comments available.  Imaging: Xr Foot Complete Right  Result Date: 03/11/2018 Films of the right foot obtained in several projections.  There is no evidence of an acute injury or fracture    PMFS History: Patient Active Problem List   Diagnosis Date Noted  . Diverticulitis of large intestine without perforation or abscess without bleeding 06/23/2016  . Other hypertrophic cardiomyopathy ([place]) 11/16/2013  . Hemangioma 02/13/2009  . Essential hypertension 02/13/2009  .  PAROXYSMAL ATRIAL FIBRILLATION 02/13/2009   Past Medical History:  Diagnosis Date  . Angioma cavernosum   . Atrial fibrillation ([place])   . HTN (hypertension)   . Hypotension 06/23/2016  . Irritable bowel syndrome (IBS)   . Seizure disorder ([place])     Family History  Problem Relation Age of Onset  . Stroke Mother   . Lung cancer Mother   . Seizures Neg Hx     Past Surgical History:  Procedure Laterality Date  . ABDOMINAL HYSTERECTOMY    . Bartholins gland cyst resection    . benign breast biopsy    . CHOLECYSTECTOMY    . SALIVARY GLAND SURGERY     Social History   Occupational History  . Not on file  Tobacco Use  . Smoking status: Never Smoker  . Smokeless tobacco:  Never Used  Substance and Sexual Activity  . Alcohol use: No  . Drug use: No  . Sexual activity: Not on file     Garald Balding, MD   Note - This record has been created using [employer].  Chart creation errors have been sought, but may not always  have been located. Such creation errors do not reflect on  the standard of medical care.

## 2018-03-30 ENCOUNTER — Ambulatory Visit
Admission: RE | Admit: 2018-03-30 | Discharge: 2018-03-30 | Disposition: A | Discharge status: Home / Self Care | Payer: Medicare Other | Source: Ambulatory Visit | Attending: Family Medicine | Admitting: Family Medicine

## 2018-03-30 ENCOUNTER — Other Ambulatory Visit: Payer: Self-pay | Admitting: Family Medicine

## 2018-03-30 DIAGNOSIS — Z1231 Encounter for screening mammogram for malignant neoplasm of breast: Secondary | ICD-10-CM

## 2018-04-07 ENCOUNTER — Other Ambulatory Visit: Payer: Self-pay | Admitting: Internal Medicine

## 2018-04-22 ENCOUNTER — Ambulatory Visit: Payer: Medicare Other | Admitting: Internal Medicine

## 2018-05-04 ENCOUNTER — Ambulatory Visit: Payer: Medicare Other | Admitting: Internal Medicine

## 2018-05-04 ENCOUNTER — Encounter: Payer: Self-pay | Admitting: Internal Medicine

## 2018-05-04 VITALS — BP 112/70 | HR 74 | Ht 67.0 in | Wt 156.0 lb

## 2018-05-04 DIAGNOSIS — I10 Essential (primary) hypertension: Secondary | ICD-10-CM

## 2018-05-04 DIAGNOSIS — I480 Paroxysmal atrial fibrillation: Secondary | ICD-10-CM

## 2018-05-04 MED ORDER — FLECAINIDE ACETATE 50 MG PO TABS: ORAL_TABLET | ORAL | 3 refills | Status: DC

## 2018-05-04 MED ORDER — METOPROLOL SUCCINATE ER 25 MG PO TB24: 25.0000 mg | ORAL_TABLET | Freq: Every day | ORAL | 3 refills | Status: DC

## 2018-05-04 NOTE — Progress Notes (Signed)
HPI Gwendolyn Alvarado returns today for follow-up. She is a 72 year old woman with paroxysmal atrial fibrillation who has been nicely controlled with flecainide and beta blocker therapy. She has a h/o neck fracture. She is not on systemic anticoagulation for her atrial fibrillation with her h/o cavernous hemangioma but has inquired about the watchman procedure. She is currently not interested. She denies chest pain or shortness of breath. She has had no symptomatic atrial fibrillation on medical therapy. Allergies  Allergen Reactions  . Carbamazepine Hives and Other (See Comments)    Blisters similar to [employer] Syndrome  . Dilantin [Phenytoin Sodium Extended] Hives and Other (See Comments)    Blisters similar to Remo Lipps Johnston's Syndrome Blisters  . Doxycycline Nausea And Vomiting and Other (See Comments)    Made her sick for about 6 weeks & lost 30 lbs Made her sick for about 6 weeks & lost 30 lbs  . Hydrocodone-Acetaminophen Other (See Comments) and Nausea And Vomiting  . Phenytoin Sodium Extended Other (See Comments) and Hives    Other reaction(s): Hives blisters  Blisters similar to [employer] Syndrome  . Phenytoin Sodium   . Hydrocodone-Acetaminophen Nausea And Vomiting  . Penicillins Itching and Rash    Other reaction(s): Itching     Current Outpatient Medications  Medication Sig Dispense Refill  . docusate sodium (COLACE) 100 MG capsule Take 1 capsule (100 mg total) by mouth 2 (two) times daily. 10 capsule 0  . flecainide (TAMBOCOR) 50 MG tablet TAKE 1 TABLET(50 MG) BY MOUTH TWICE DAILY 180 tablet 3  . Lacosamide (VIMPAT) 150 MG TABS Take 1 tablet by mouth 2 (two) times daily.      . metoprolol succinate (TOPROL-XL) 25 MG 24 hr tablet Take 1 tablet (25 mg total) by mouth daily. 90 tablet 3   No current facility-administered medications for this visit.      Past Medical History:  Diagnosis Date  . Angioma cavernosum   . Atrial fibrillation ([place])   . HTN  (hypertension)   . Hypotension 06/23/2016  . Irritable bowel syndrome (IBS)   . Seizure disorder ([place])     ROS:   All systems reviewed and negative except as noted in the HPI.   Past Surgical History:  Procedure Laterality Date  . ABDOMINAL HYSTERECTOMY    . Bartholins gland cyst resection    . benign breast biopsy    . BREAST EXCISIONAL BIOPSY Right    benign  . CHOLECYSTECTOMY    . SALIVARY GLAND SURGERY       Family History  Problem Relation Age of Onset  . Stroke Mother   . Lung cancer Mother   . Seizures Neg Hx      Social History   Socioeconomic History  . Marital status: Married    Spouse name: Not on file  . Number of children: 3  . Years of education: Not on file  . Highest education level: Not on file  Occupational History  . Not on file  Social Needs  . Financial resource strain: Not on file  . Food insecurity:    Worry: Not on file    Inability: Not on file  . Transportation needs:    Medical: Not on file    Non-medical: Not on file  Tobacco Use  . Smoking status: Never Smoker  . Smokeless tobacco: Never Used  Substance and Sexual Activity  . Alcohol use: No  . Drug use: No  . Sexual activity: Not on file  Lifestyle  . Physical activity:    Days per week: Not on file    Minutes per session: Not on file  . Stress: Not on file  Relationships  . Social connections:    Talks on phone: Not on file    Gets together: Not on file    Attends religious service: Not on file    Active member of club or organization: Not on file    Attends meetings of clubs or organizations: Not on file    Relationship status: Not on file  . Intimate partner violence:    Fear of current or ex partner: Not on file    Emotionally abused: Not on file    Physically abused: Not on file    Forced sexual activity: Not on file  Other Topics Concern  . Not on file  Social History Narrative  . Not on file     BP 112/70   Pulse 74   Ht 5\' 7"  (1.702 m)   Wt 156  lb (70.8 kg)   BMI 24.43 kg/m   Physical Exam:  Well appearing NAD HEENT: Unremarkable Neck:  6 cm JVD, no thyromegally Lymphatics:  No adenopathy Back:  No CVA tenderness Lungs:  Clear with no wheezes HEART:  Regular rate rhythm, no murmurs, no rubs, no clicks Abd:  soft, positive bowel sounds, no organomegally, no rebound, no guarding Ext:  2 plus pulses, no edema, no cyanosis, no clubbing Skin:  No rashes no nodules Neuro:  CN II through XII intact, motor grossly intact  EKG - nsr  Assess/Plan: 1. PAF - she will continue her flecainide. We discussed systemic anti-coagulation. CHADSVASC is 2. She tells me that she is not thought to be a candidate and is not interested in the Watchman procedure. I have asked her to speak to her MD about the hemangioma and whether or not she could be treated with an [place].  Mikle Bosworth.D.

## 2018-05-04 NOTE — Patient Instructions (Signed)
Your physician recommends that you continue on your current medications as directed. Please refer to the Current Medication list given to you today.  Your physician wants you to follow-up in: 1 year with Dr. Taylor.  You will receive a reminder letter in the mail two months in advance. If you don't receive a letter, please call our office to schedule the follow-up appointment.  

## 2018-07-09 ENCOUNTER — Other Ambulatory Visit: Payer: Self-pay | Admitting: Internal Medicine

## 2018-07-09 NOTE — Telephone Encounter (Signed)
Outpatient Medication Detail    Disp Refills Start End   flecainide (TAMBOCOR) 50 MG tablet 180 tablet 3 05/04/2018    Sig: TAKE 1 TABLET(50 MG) BY MOUTH TWICE DAILY   Sent to pharmacy as: flecainide (TAMBOCOR) 50 MG tablet   E-Prescribing Status: Receipt confirmed by pharmacy (05/04/2018 2:14 PM EDT)   Pharmacy   [place], [address]. [address]

## 2018-09-07 ENCOUNTER — Telehealth (INDEPENDENT_AMBULATORY_CARE_PROVIDER_SITE_OTHER): Payer: Self-pay | Admitting: Internal Medicine

## 2018-09-07 NOTE — Telephone Encounter (Signed)
Patient came by office stated her diverticulitis is flaring up she usually takes flagyl and cipro for this - she would like a call back at [phone]

## 2018-09-08 ENCOUNTER — Emergency Department (HOSPITAL_COMMUNITY)
Admission: EM | Admit: 2018-09-08 | Discharge: 2018-09-08 | Disposition: A | Discharge status: Home / Self Care | Payer: Medicare Other | Attending: Emergency Medicine | Admitting: Emergency Medicine

## 2018-09-08 ENCOUNTER — Encounter (HOSPITAL_COMMUNITY): Payer: Self-pay

## 2018-09-08 DIAGNOSIS — I10 Essential (primary) hypertension: Secondary | ICD-10-CM | POA: Diagnosis not present

## 2018-09-08 DIAGNOSIS — Z79899 Other long term (current) drug therapy: Secondary | ICD-10-CM | POA: Insufficient documentation

## 2018-09-08 DIAGNOSIS — K5790 Diverticulosis of intestine, part unspecified, without perforation or abscess without bleeding: Secondary | ICD-10-CM | POA: Diagnosis not present

## 2018-09-08 DIAGNOSIS — K5792 Diverticulitis of intestine, part unspecified, without perforation or abscess without bleeding: Secondary | ICD-10-CM

## 2018-09-08 DIAGNOSIS — R1030 Lower abdominal pain, unspecified: Secondary | ICD-10-CM | POA: Diagnosis present

## 2018-09-08 LAB — COMPREHENSIVE METABOLIC PANEL
ALT: 21 U/L (ref 0–44)
AST: 21 U/L (ref 15–41)
Albumin: 3.7 g/dL (ref 3.5–5.0)
Alkaline Phosphatase: 64 U/L (ref 38–126)
Anion gap: 11 (ref 5–15)
BUN: 14 mg/dL (ref 8–23)
CO2: 20 mmol/L — ABNORMAL LOW (ref 22–32)
Calcium: 8.8 mg/dL — ABNORMAL LOW (ref 8.9–10.3)
Chloride: 106 mmol/L (ref 98–111)
Creatinine, Ser: 0.85 mg/dL (ref 0.44–1.00)
GFR calc Af Amer: >60 mL/min (ref >60)
GFR calc non Af Amer: >60 mL/min (ref >60)
Glucose, Bld: 100 mg/dL — ABNORMAL HIGH (ref 70–99)
Potassium: 4.1 mmol/L (ref 3.5–5.1)
Sodium: 137 mmol/L (ref 135–145)
Total Bilirubin: 0.9 mg/dL (ref 0.3–1.2)
Total Protein: 6.6 g/dL (ref 6.5–8.1)

## 2018-09-08 LAB — CBC
HCT: 43.8 % (ref 36.0–46.0)
Hemoglobin: 14.8 g/dL (ref 12.0–15.0)
MCH: 31.8 pg (ref 26.0–34.0)
MCHC: 33.8 g/dL (ref 30.0–36.0)
MCV: 94.2 fL (ref 80.0–100.0)
Platelets: 213 10*3/uL (ref 150–400)
RBC: 4.65 MIL/uL (ref 3.87–5.11)
RDW: 13 % (ref 11.5–15.5)
WBC: 7.6 10*3/uL (ref 4.0–10.5)
nRBC: 0 % (ref 0.0–0.2)

## 2018-09-08 LAB — URINALYSIS, ROUTINE W REFLEX MICROSCOPIC
Bilirubin Urine: NEGATIVE
Glucose, UA: NEGATIVE mg/dL
Hgb urine dipstick: NEGATIVE
Ketones, ur: NEGATIVE mg/dL
Leukocytes, UA: NEGATIVE
Nitrite: NEGATIVE
Protein, ur: NEGATIVE mg/dL
Specific Gravity, Urine: 1.004 — ABNORMAL LOW (ref 1.005–1.030)
pH: 7 (ref 5.0–8.0)

## 2018-09-08 LAB — LIPASE, BLOOD: Lipase: 29 U/L (ref 11–51)

## 2018-09-08 MED ORDER — CIPROFLOXACIN HCL 500 MG PO TABS: 500.0000 mg | ORAL_TABLET | Freq: Two times a day (BID) | ORAL | 0 refills | Status: DC

## 2018-09-08 MED ORDER — SODIUM CHLORIDE 0.9% FLUSH: 3.0000 mL | Freq: Once | INTRAVENOUS | Status: DC

## 2018-09-08 MED ORDER — METRONIDAZOLE 500 MG PO TABS: 500.0000 mg | ORAL_TABLET | Freq: Two times a day (BID) | ORAL | 0 refills | Status: DC

## 2018-09-08 NOTE — ED Notes (Signed)
Updated on wait for treatment room. 

## 2018-09-08 NOTE — ED Triage Notes (Signed)
Pt presents for evaluation of lower abd pain x 2 days. Denies diarrhea. Reports some nausea this morning.

## 2018-09-08 NOTE — ED Provider Notes (Signed)
[facility] EMERGENCY DEPARTMENT Provider Note   CSN: [id number] Arrival date & time: 09/08/18  1209     History   Chief Complaint Chief Complaint  Patient presents with  . Abdominal Pain    HPI Gwendolyn Alvarado is a 73 y.o. adult.  HPI Patient presents with abdominal pain.  Dull in her lower abdomen.  States it feels like when she has had previous diverticulitis.  States she has had 2 previous episodes 1 of which involved sepsis and one which did not.  No fevers.  Has had some nausea without vomiting.  No diarrhea.  Slight constipation.  Overall has had a good appetite.  No dysuria. Past Medical History:  Diagnosis Date  . Angioma cavernosum   . Atrial fibrillation ([place])   . HTN (hypertension)   . Hypotension 06/23/2016  . Irritable bowel syndrome (IBS)   . Seizure disorder [facility])     Patient Active Problem List   Diagnosis Date Noted  . Diverticulitis of large intestine without perforation or abscess without bleeding 06/23/2016  . Other hypertrophic cardiomyopathy ([place]) 11/16/2013  . Hemangioma 02/13/2009  . Essential hypertension 02/13/2009  . PAROXYSMAL ATRIAL FIBRILLATION 02/13/2009    Past Surgical History:  Procedure Laterality Date  . ABDOMINAL HYSTERECTOMY    . Bartholins gland cyst resection    . benign breast biopsy    . BREAST EXCISIONAL BIOPSY Right    benign  . CHOLECYSTECTOMY    . SALIVARY GLAND SURGERY       OB History   No obstetric history on file.      Home Medications    Prior to Admission medications   Medication Sig Start Date End Date Taking? Authorizing Provider  ciprofloxacin (CIPRO) 500 MG tablet Take 1 tablet (500 mg total) by mouth 2 (two) times daily. 09/08/18   Davonna Belling, MD  docusate sodium (COLACE) 100 MG capsule Take 1 capsule (100 mg total) by mouth 2 (two) times daily. 01/22/18   Rogene Houston, MD  flecainide (TAMBOCOR) 50 MG tablet TAKE 1 TABLET(50 MG) BY MOUTH TWICE DAILY 05/04/18   Evans Lance,  MD  Lacosamide (VIMPAT) 150 MG TABS Take 1 tablet by mouth 2 (two) times daily.      [provider]  metoprolol succinate (TOPROL-XL) 25 MG 24 hr tablet Take 1 tablet (25 mg total) by mouth daily. 05/04/18   Evans Lance, MD  metroNIDAZOLE (FLAGYL) 500 MG tablet Take 1 tablet (500 mg total) by mouth 2 (two) times daily. 09/08/18   Davonna Belling, MD    Family History Family History  Problem Relation Age of Onset  . Stroke Mother   . Lung cancer Mother   . Seizures Neg Hx     Social History Social History   Tobacco Use  . Smoking status: Never Smoker  . Smokeless tobacco: Never Used  Substance Use Topics  . Alcohol use: No  . Drug use: No     Allergies   Carbamazepine; Dilantin [phenytoin sodium extended]; Doxycycline; Hydrocodone-acetaminophen; Phenytoin sodium extended; Phenytoin sodium; Hydrocodone-acetaminophen; and Penicillins   Review of Systems Review of Systems  Constitutional: Negative for appetite change.  HENT: Negative for congestion.   Respiratory: Negative for shortness of breath.   Cardiovascular: Negative for chest pain.  Gastrointestinal: Positive for abdominal pain and nausea. Negative for diarrhea and rectal pain.  Genitourinary: Negative for flank pain.  Musculoskeletal: Negative for back pain.  Skin: Negative for rash.  Neurological: Negative for weakness.  Psychiatric/Behavioral:  Negative for confusion.     Physical Exam Updated Vital Signs BP (!) 119/57   Pulse 63   Temp 98.1 F (36.7 C) (Oral)   Resp 14   SpO2 100%   Physical Exam HENT:     Head: Atraumatic.     Mouth/Throat:     Mouth: Mucous membranes are moist.  Eyes:     General: No scleral icterus. Neck:     Musculoskeletal: Neck supple.  Cardiovascular:     Rate and Rhythm: Regular rhythm.  Pulmonary:     Breath sounds: No wheezing, rhonchi or rales.  Abdominal:     Tenderness: There is abdominal tenderness.     Comments: Mild lower abdominal tenderness  without rebound or guarding.  No mass palpated.  Musculoskeletal:     Right lower leg: No edema.     Left lower leg: No edema.  Skin:    General: Skin is warm.     Capillary Refill: Capillary refill takes less than 2 seconds.  Neurological:     General: No focal deficit present.     Mental Status: He is alert.      ED Treatments / Results  Labs (all labs ordered are listed, but only abnormal results are displayed) Labs Reviewed  COMPREHENSIVE METABOLIC PANEL - Abnormal; Notable for the following components:      Result Value   CO2 20 (*)    Glucose, Bld 100 (*)    Calcium 8.8 (*)    All other components within normal limits  URINALYSIS, ROUTINE W REFLEX MICROSCOPIC - Abnormal; Notable for the following components:   Color, Urine STRAW (*)    Specific Gravity, Urine 1.004 (*)    All other components within normal limits  LIPASE, BLOOD  CBC    EKG None  Radiology No results found.  Procedures Procedures (including critical care time)  Medications Ordered in ED Medications  sodium chloride flush (NS) 0.9 % injection 3 mL (has no administration in time range)     Initial Impression / Assessment and Plan / ED Course  I have reviewed the triage vital signs and the nursing notes.  Pertinent labs & imaging results that were available during my care of the patient were reviewed by me and considered in my medical decision making (see chart for details).     Patient with abdominal pain.  Has had for last 2 days.  Some nausea.  Benign exam and lab work reassuring.  Does have history of diverticulitis and I think at this point it is reasonable to treat empirically without getting any CT scan.  Will give Cipro and Flagyl.  Follow-up with PCP as needed.  Final Clinical Impressions(s) / ED Diagnoses   Final diagnoses:  Diverticulitis    ED Discharge Orders         Ordered    ciprofloxacin (CIPRO) 500 MG tablet  2 times daily     09/08/18 1823    metroNIDAZOLE  (FLAGYL) 500 MG tablet  2 times daily     09/08/18 1823           Davonna Belling, MD 09/08/18 1824

## 2018-09-10 NOTE — Telephone Encounter (Signed)
Patient was called and a message was left asking that she call our office back, we were wanting to follow up with her after her ED visit.  Abdominal Pain    HPI Gwendolyn Alvarado is a 73 y.o. adult.  HPI Patient presents with abdominal pain.  Dull in her lower abdomen.  States it feels like when she has had previous diverticulitis.  States she has had 2 previous episodes 1 of which involved sepsis and one which did not.  No fevers.  Has had some nausea without vomiting.  No diarrhea.  Slight constipation.  Overall has had a good appetite.  No dysuria.     Past Medical History:  Diagnosis Date  . Angioma cavernosum   . Atrial fibrillation ([place])   . HTN (hypertension)   . Hypotension 06/23/2016  . Irritable bowel syndrome (IBS)   . Seizure disorder [facility])         Patient Active Problem List   Diagnosis Date Noted  . Diverticulitis of large intestine without perforation or abscess without bleeding 06/23/2016  . Other hypertrophic cardiomyopathy ([place]) 11/16/2013  . Hemangioma 02/13/2009  . Essential hypertension 02/13/2009  . PAROXYSMAL ATRIAL FIBRILLATION 02/13/2009         Past Surgical History:  Procedure Laterality Date  . ABDOMINAL HYSTERECTOMY    . Bartholins gland cyst resection    . benign breast biopsy    . BREAST EXCISIONAL BIOPSY Right    benign  . CHOLECYSTECTOMY    . SALIVARY GLAND SURGERY                  OB History   No obstetric history on file.      Home Medications                      Prior to Admission medications   Medication Sig Start Date End Date Taking? Authorizing Provider  ciprofloxacin (CIPRO) 500 MG tablet Take 1 tablet (500 mg total) by mouth 2 (two) times daily. 09/08/18   Davonna Belling, MD  docusate sodium (COLACE) 100 MG capsule Take 1 capsule (100 mg total) by mouth 2 (two) times daily. 01/22/18   Rogene Houston, MD  flecainide (TAMBOCOR) 50 MG tablet TAKE 1 TABLET(50 MG) BY MOUTH TWICE DAILY 05/04/18    Evans Lance, MD  Lacosamide (VIMPAT) 150 MG TABS Take 1 tablet by mouth 2 (two) times daily.      [provider]  metoprolol succinate (TOPROL-XL) 25 MG 24 hr tablet Take 1 tablet (25 mg total) by mouth daily. 05/04/18   Evans Lance, MD  metroNIDAZOLE (FLAGYL) 500 MG tablet Take 1 tablet (500 mg total) by mouth 2 (two) times daily. 09/08/18   Davonna Belling, MD    Family History      Family History  Problem Relation Age of Onset  . Stroke Mother   . Lung cancer Mother   . Seizures Neg Hx     Social History Social History       Tobacco Use  . Smoking status: Never Smoker  . Smokeless tobacco: Never Used  Substance Use Topics  . Alcohol use: No  . Drug use: No     Allergies              Carbamazepine; Dilantin [phenytoin sodium extended]; Doxycycline; Hydrocodone-acetaminophen; Phenytoin sodium extended; Phenytoin sodium; Hydrocodone-acetaminophen; and Penicillins   Review of Systems Review of Systems  Constitutional: Negative for appetite change.  HENT: Negative for  congestion.   Respiratory: Negative for shortness of breath.   Cardiovascular: Negative for chest pain.  Gastrointestinal: Positive for abdominal pain and nausea. Negative for diarrhea and rectal pain.  Genitourinary: Negative for flank pain.  Musculoskeletal: Negative for back pain.  Skin: Negative for rash.  Neurological: Negative for weakness.  Psychiatric/Behavioral: Negative for confusion.     Physical Exam Updated Vital Signs BP (!) 119/57   Pulse 63   Temp 98.1 F (36.7 C) (Oral)   Resp 14   SpO2 100%   Physical Exam HENT:     Head: Atraumatic.     Mouth/Throat:     Mouth: Mucous membranes are moist.  Eyes:     General: No scleral icterus. Neck:     Musculoskeletal: Neck supple.  Cardiovascular:     Rate and Rhythm: Regular rhythm.  Pulmonary:     Breath sounds: No wheezing, rhonchi or rales.  Abdominal:     Tenderness: There is  abdominal tenderness.     Comments: Mild lower abdominal tenderness without rebound or guarding.  No mass palpated.  Musculoskeletal:     Right lower leg: No edema.     Left lower leg: No edema.  Skin:    General: Skin is warm.     Capillary Refill: Capillary refill takes less than 2 seconds.  Neurological:     General: No focal deficit present.     Mental Status: He is alert.      ED Treatments / Results  Labs (all labs ordered are listed, but only abnormal results are displayed)      Labs Reviewed  COMPREHENSIVE METABOLIC PANEL - Abnormal; Notable for the following components:      Result Value    CO2 20 (*)    Glucose, Bld 100 (*)    Calcium 8.8 (*)    All other components within normal limits  URINALYSIS, ROUTINE W REFLEX MICROSCOPIC - Abnormal; Notable for the following components:   Color, Urine STRAW (*)    Specific Gravity, Urine 1.004 (*)    All other components within normal limits  LIPASE, BLOOD  CBC    EKG None  Radiology ImagingResults(Last48hours)  No results found.    Procedures Procedures (including critical care time)  Medications Ordered in ED Medications  sodium chloride flush (NS) 0.9 % injection 3 mL (has no administration in time range)     Initial Impression / Assessment and Plan / ED Course  I have reviewed the triage vital signs and the nursing notes.  Pertinent labs & imaging results that were available during my care of the patient were reviewed by me and considered in my medical decision making (see chart for details).   Patient with abdominal pain.  Has had for last 2 days.  Some nausea.  Benign exam and lab work reassuring.  Does have history of diverticulitis and I think at this point it is reasonable to treat empirically without getting any CT scan.  Will give Cipro and Flagyl.  Follow-up with PCP as needed.  Final Clinical Impressions(s) / ED Diagnoses   Final diagnoses:  Diverticulitis        ED Discharge Orders               Ordered     ciprofloxacin (CIPRO) 500 MG tablet  2 times daily     09/08/18 1823     metroNIDAZOLE (FLAGYL) 500 MG tablet  2 times daily     09/08/18 1823  Davonna Belling, MD 09/08/18 1824         Electronically signed by Davonna Belling, MD at 09/08/2018 6:24 PM

## 2018-10-29 ENCOUNTER — Encounter: Payer: Self-pay | Admitting: *Deleted

## 2019-02-26 ENCOUNTER — Other Ambulatory Visit: Payer: Self-pay | Admitting: Family Medicine

## 2019-02-26 ENCOUNTER — Telehealth: Payer: Self-pay

## 2019-02-26 DIAGNOSIS — Z1231 Encounter for screening mammogram for malignant neoplasm of breast: Secondary | ICD-10-CM

## 2019-02-26 NOTE — Telephone Encounter (Signed)
Spoke with patient, who has an upcoming appointment with Dr. Henrene Pastor, to clarify that her intention was to switch care from Dr. Laural Golden to Dr. Henrene Pastor.  She said she has no plans to go back to Dr. Laural Golden for any reason and wants to make a legitimate transfer.

## 2019-03-08 ENCOUNTER — Encounter (INDEPENDENT_AMBULATORY_CARE_PROVIDER_SITE_OTHER): Payer: Self-pay

## 2019-03-08 ENCOUNTER — Encounter: Payer: Self-pay | Admitting: Internal Medicine

## 2019-03-08 ENCOUNTER — Ambulatory Visit (INDEPENDENT_AMBULATORY_CARE_PROVIDER_SITE_OTHER): Payer: Medicare Other | Admitting: Internal Medicine

## 2019-03-08 VITALS — BP 120/70 | HR 68 | Temp 98.2°F | Ht 64.0 in | Wt 159.1 lb

## 2019-03-08 DIAGNOSIS — R197 Diarrhea, unspecified: Secondary | ICD-10-CM | POA: Diagnosis not present

## 2019-03-08 DIAGNOSIS — K5732 Diverticulitis of large intestine without perforation or abscess without bleeding: Secondary | ICD-10-CM | POA: Diagnosis not present

## 2019-03-08 DIAGNOSIS — R109 Unspecified abdominal pain: Secondary | ICD-10-CM

## 2019-03-08 MED ORDER — DICYCLOMINE HCL 10 MG PO CAPS: ORAL_CAPSULE | ORAL | 0 refills | Status: DC

## 2019-03-08 NOTE — Patient Instructions (Signed)
Take 1 tablespoon of Metamucil daily for a week and then increase to 2 tablespoons  We have sent the following medications to your pharmacy for you to pick up at your convenience:  Bentyl

## 2019-03-08 NOTE — Progress Notes (Signed)
HISTORY OF PRESENT ILLNESS:  Gwendolyn Alvarado is a 73 y.o. adult female, wife of Ed, who self-referred regarding chronic GI complaints.  Seen in this office remotely by Dr. Velora Heckler.  Also cared for by gastroenterologist in [place] and [place].  Her GI diagnoses include irritable bowel syndrome and recurrent diverticulitis.  She presents regarding both of those issues.  She is status post cholecystectomy remotely.  She tells me that she has had problems with her bowels intermittently for many years.  She describes every 2 or 3 days approximately 40 minutes after her lunch typically she will develop abdominal discomfort followed by 5-6 bowel movements.  Initially formed but subsequently loose.  After completing defecation she feels better.  Intervening days can be normal.  She rarely takes a stool softener.  No antidiarrheals.  She also has a history of recurrent diverticulitis, being hospitalized on one occasion.  Last bout February 2020.  I have reviewed that emergency room visit.  She was treated with Cipro and Flagyl with good results.  Blood work from February 2020 finds unremarkable comprehensive metabolic panel.  Normal CBC with hemoglobin 14.8.  CT scan of the abdomen pelvis November 9767 revealed uncomplicated sigmoid diverticulitis.  Previous colonoscopy in [facility] with Dr. Chapman Fitch was performed Dec 12, 2016.  Examination was normal except for moderate diverticulosis.  Her daughter is a physician with [employer]  REVIEW OF SYSTEMS:  All non-GI ROS negative unless otherwise stated in the HPI except for urinary leakage  Past Medical History:  Diagnosis Date  . Angioma cavernosum   . Atrial fibrillation ([place])   . Colon polyps   . Diverticulitis   . Diverticulosis   . HTN (hypertension)   . Hypotension 06/23/2016  . Irritable bowel syndrome (IBS)   . Seizure disorder [facility])     Past Surgical History:  Procedure Laterality Date  . ABDOMINAL HYSTERECTOMY    . Bartholins gland  cyst resection    . BREAST EXCISIONAL BIOPSY Right    benign  . CERVICAL DISC SURGERY     C2  . CHOLECYSTECTOMY    . SALIVARY GLAND SURGERY      Social History ARGUSTA MCGANN  reports that he has never smoked. He has never used smokeless tobacco. He reports that he does not drink alcohol or use drugs.  family history includes Lung cancer in his mother; Stroke in his mother.  Allergies  Allergen Reactions  . Carbamazepine Hives and Other (See Comments)    Blisters similar to [employer] Syndrome  . Dilantin [Phenytoin Sodium Extended] Hives and Other (See Comments)    Blisters similar to Remo Lipps Johnston's Syndrome Blisters  . Doxycycline Nausea And Vomiting and Other (See Comments)    Made her sick for about 6 weeks & lost 30 lbs Made her sick for about 6 weeks & lost 30 lbs  . Phenytoin Sodium Extended Other (See Comments) and Hives    Other reaction(s): Hives blisters  Blisters similar to [employer] Syndrome  . Vicodin [Hydrocodone-Acetaminophen] Other (See Comments) and Nausea And Vomiting  . Phenytoin Sodium   . Hydrocodone-Acetaminophen Nausea And Vomiting  . Penicillins Itching and Rash    Other reaction(s): Itching       PHYSICAL EXAMINATION: Vital signs: BP 120/70 (BP Location: Left Arm, Patient Position: Sitting, Cuff Size: Normal)   Pulse 68   Temp 98.2 F (36.8 C)   Ht 5\' 4"  (1.626 m) Comment: height measured without shoes  Wt 159 lb 2 oz (72.2  kg)   BMI 27.31 kg/m   Constitutional: generally well-appearing, no acute distress Psychiatric: alert and oriented x3, cooperative Eyes: extraocular movements intact, anicteric, conjunctiva pink Mouth: oral pharynx moist, no lesions Neck: supple no lymphadenopathy Cardiovascular: heart regular rate and rhythm, no murmur Lungs: clear to auscultation bilaterally Abdomen: soft, nontender, nondistended, no obvious ascites, no peritoneal signs, normal bowel sounds, no organomegaly Rectal: Omitted Extremities:  no clubbing, cyanosis, or lower extremity edema bilaterally Skin: no lesions on visible extremities Neuro: No focal deficits.  Cranial nerves intact  ASSESSMENT:  1.  Irritable bowel syndrome. 2.  Recurrent diverticulitis.  Currently asymptomatic.  She has responded to Cipro and Flagyl in the past without issues 3.  Colonoscopy May 2018 normal except for diverticulosis.  Aged out of routine screening   PLAN:  1.  Recommended blood testing for possible celiac disease.  The patient wishes to hold off stating that she is a difficult blood stick.  I think that is reasonable 2.  Recommend fortified fiber supplementation with Metamucil 1 tablespoon daily for a week then increase to 2 tablespoons daily in 14 ounces of water or juice 3.  Prescribe Bentyl 10 mg; 1-2 every 4-6 hours as needed for abdominal discomfort 4.  If the patient has symptoms suggesting diverticulitis she should contact this office and would be prescribed antibiotics with possible follow-up to be arranged as well. 5.  Routine GI office follow-up 6 to 8 weeks

## 2019-04-15 ENCOUNTER — Other Ambulatory Visit: Payer: Self-pay | Admitting: Internal Medicine

## 2019-04-15 DIAGNOSIS — I10 Essential (primary) hypertension: Secondary | ICD-10-CM

## 2019-04-16 ENCOUNTER — Ambulatory Visit
Admission: RE | Admit: 2019-04-16 | Discharge: 2019-04-16 | Disposition: A | Discharge status: Home / Self Care | Payer: Medicare Other | Source: Ambulatory Visit | Attending: Family Medicine | Admitting: Family Medicine

## 2019-04-16 ENCOUNTER — Other Ambulatory Visit: Payer: Self-pay

## 2019-04-16 DIAGNOSIS — Z1231 Encounter for screening mammogram for malignant neoplasm of breast: Secondary | ICD-10-CM

## 2019-04-28 ENCOUNTER — Other Ambulatory Visit: Payer: Self-pay

## 2019-04-28 ENCOUNTER — Encounter: Payer: Self-pay | Admitting: Internal Medicine

## 2019-04-28 ENCOUNTER — Ambulatory Visit (INDEPENDENT_AMBULATORY_CARE_PROVIDER_SITE_OTHER): Payer: Medicare Other | Admitting: Internal Medicine

## 2019-04-28 VITALS — BP 100/64 | HR 72 | Temp 98.0°F | Ht 67.0 in | Wt 159.8 lb

## 2019-04-28 DIAGNOSIS — R109 Unspecified abdominal pain: Secondary | ICD-10-CM

## 2019-04-28 DIAGNOSIS — K582 Mixed irritable bowel syndrome: Secondary | ICD-10-CM

## 2019-04-28 DIAGNOSIS — R197 Diarrhea, unspecified: Secondary | ICD-10-CM | POA: Diagnosis not present

## 2019-04-28 MED ORDER — DICYCLOMINE HCL 10 MG PO CAPS: ORAL_CAPSULE | ORAL | 11 refills | Status: DC

## 2019-04-28 NOTE — Patient Instructions (Signed)
Use fiber daily.   We have sent the following medications to your pharmacy for you to pick up at your convenience: Bentyl

## 2019-04-28 NOTE — Progress Notes (Signed)
HISTORY OF PRESENT ILLNESS:  Gwendolyn Alvarado is a 73 y.o. adult female, wife of Ed, who was initially evaluated March 08, 2019 upon self-referral regarding chronic GI complaints.  See that dictation for details.  She was felt to have diarrhea predominant irritable bowel syndrome.  Also history of recurrent diverticulitis.  Previous colonoscopy May 2018 was normal except for diverticulosis.  Aged out of screening.  Testing for celiac disease was not performed due to patient reluctance for blood draw.  She was instructed to initiate Metamucil 1 to 2 tablespoons daily.  Also prescribed Bentyl as needed for abdominal discomfort.  She follows up at this time.  The patient reports that she is markedly improved since her last visit.  She has been compliant with therapies.  Bowel habits have improved.  Abdominal discomfort much less.  Only needing Bentyl 2 or 3 times per week.  Bowel habit consistency improved.  No new problems.  No evidence of recurrent diverticulitis.  She does request medication refill  REVIEW OF SYSTEMS:  All non-GI ROS negative unless otherwise stated in the HPI.  Past Medical History:  Diagnosis Date  . Angioma cavernosum   . Atrial fibrillation ([place])   . Colon polyps   . Diverticulitis   . Diverticulosis   . HTN (hypertension)   . Hypotension 06/23/2016  . Irritable bowel syndrome (IBS)   . Seizure disorder [facility])     Past Surgical History:  Procedure Laterality Date  . ABDOMINAL HYSTERECTOMY    . Bartholins gland cyst resection    . BREAST EXCISIONAL BIOPSY Right    benign  . CERVICAL DISC SURGERY     C2  . CHOLECYSTECTOMY    . SALIVARY GLAND SURGERY      Social History Valma Metzger  reports that he has never smoked. He has never used smokeless tobacco. He reports that he does not drink alcohol or use drugs.  family history includes Lung cancer in his mother; Stroke in his mother.  Allergies  Allergen Reactions  . Carbamazepine Hives and Other (See Comments)     Blisters similar to [employer] Syndrome  . Dilantin [Phenytoin Sodium Extended] Hives and Other (See Comments)    Blisters similar to Remo Lipps Johnston's Syndrome Blisters  . Doxycycline Nausea And Vomiting and Other (See Comments)    Made her sick for about 6 weeks & lost 30 lbs Made her sick for about 6 weeks & lost 30 lbs  . Phenytoin Sodium Extended Other (See Comments) and Hives    Other reaction(s): Hives blisters  Blisters similar to [employer] Syndrome  . Vicodin [Hydrocodone-Acetaminophen] Other (See Comments) and Nausea And Vomiting  . Phenytoin Sodium   . Hydrocodone-Acetaminophen Nausea And Vomiting  . Penicillins Itching and Rash    Other reaction(s): Itching       PHYSICAL EXAMINATION: Vital signs: BP 100/64   Pulse 72   Temp 98 F (36.7 C) (Oral)   Ht 5\' 7"  (1.702 m)   Wt 159 lb 12.8 oz (72.5 kg)   BMI 25.03 kg/m   Constitutional: generally well-appearing, no acute distress Psychiatric: alert and oriented x3, cooperative Abdomen: Not reexamined Neuro: No focal deficits.   ASSESSMENT:  1.  Irritable bowel syndrome.  Improved with fiber and antispasmodics 2.  History of recurrent diverticulitis. 3.  Colonoscopy elsewhere May 2018- except for diverticulosis   PLAN:  1.  Continue Metamucil 2 tablespoons daily 2.  Continue Bentyl as needed for abdominal cramping.  Prescription refilled.  Medication side  effects reviewed 3.  Contact the office should she feel like she is having a diverticulitis flare 4.  Otherwise, routine office follow-up 1 year. 15-minute spent face-to-face with the patient.  Greater than 50% of the time used for counseling regarding her irritable bowel syndrome and diverticulitis

## 2019-05-04 ENCOUNTER — Other Ambulatory Visit: Payer: Self-pay

## 2019-05-04 ENCOUNTER — Ambulatory Visit (INDEPENDENT_AMBULATORY_CARE_PROVIDER_SITE_OTHER): Payer: Medicare Other | Admitting: Internal Medicine

## 2019-05-04 ENCOUNTER — Encounter: Payer: Self-pay | Admitting: Internal Medicine

## 2019-05-04 VITALS — BP 108/64 | HR 60 | Ht 67.0 in | Wt 157.0 lb

## 2019-05-04 DIAGNOSIS — I480 Paroxysmal atrial fibrillation: Secondary | ICD-10-CM | POA: Diagnosis not present

## 2019-05-04 DIAGNOSIS — I10 Essential (primary) hypertension: Secondary | ICD-10-CM | POA: Diagnosis not present

## 2019-05-04 MED ORDER — METOPROLOL SUCCINATE ER 25 MG PO TB24: 25.0000 mg | ORAL_TABLET | Freq: Every day | ORAL | 3 refills | Status: DC

## 2019-05-04 MED ORDER — FLECAINIDE ACETATE 50 MG PO TABS: ORAL_TABLET | ORAL | 3 refills | Status: DC

## 2019-05-04 NOTE — Patient Instructions (Signed)

## 2019-05-04 NOTE — Progress Notes (Signed)
HPI Mrs. Bonk returns today for follow-up. She is a 73 year old woman with paroxysmal atrial fibrillation who has been nicely controlled with flecainide and beta blocker therapy. She has a h/o neck fracture. She is not on systemic anticoagulation for her atrial fibrillation with her h/o cavernous hemangioma. She denies chest pain or shortness of breath. She has had no symptomatic atrial fibrillation on medical therapy. Allergies  Allergen Reactions  . Carbamazepine Hives and Other (See Comments)    Blisters similar to [employer] Syndrome  . Dilantin [Phenytoin Sodium Extended] Hives and Other (See Comments)    Blisters similar to Remo Lipps Johnston's Syndrome Blisters  . Doxycycline Nausea And Vomiting and Other (See Comments)    Made her sick for about 6 weeks & lost 30 lbs Made her sick for about 6 weeks & lost 30 lbs  . Phenytoin Sodium Extended Other (See Comments) and Hives    Other reaction(s): Hives blisters  Blisters similar to [employer] Syndrome  . Vicodin [Hydrocodone-Acetaminophen] Other (See Comments) and Nausea And Vomiting  . Phenytoin Sodium   . Hydrocodone-Acetaminophen Nausea And Vomiting  . Penicillins Itching and Rash    Other reaction(s): Itching     Current Outpatient Medications  Medication Sig Dispense Refill  . DEXILANT 60 MG capsule Take 1 tablet by mouth daily.    . diclofenac sodium (VOLTAREN) 1 % GEL Apply 2-4 g topically 3 (three) times daily as needed.    . dicyclomine (BENTYL) 10 MG capsule Take 1-2 capsules every 4-6 hours as needed 60 capsule 11  . docusate sodium (COLACE) 100 MG capsule Take 1 capsule (100 mg total) by mouth 2 (two) times daily. (Patient taking differently: Take 100 mg by mouth as needed. ) 10 capsule 0  . flecainide (TAMBOCOR) 50 MG tablet TAKE 1 TABLET(50 MG) BY MOUTH TWICE DAILY 180 tablet 3  . Lacosamide (VIMPAT) 150 MG TABS Take 1 tablet by mouth 2 (two) times daily.      . metoprolol succinate (TOPROL-XL) 25  MG 24 hr tablet Take 1 tablet (25 mg total) by mouth daily. Pt must keep upcoming appt in Sept for further refills - thanks 30 tablet 0   No current facility-administered medications for this visit.      Past Medical History:  Diagnosis Date  . Angioma cavernosum   . Atrial fibrillation ([place])   . Colon polyps   . Diverticulitis   . Diverticulosis   . HTN (hypertension)   . Hypotension 06/23/2016  . Irritable bowel syndrome (IBS)   . Seizure disorder ([place])     ROS:   All systems reviewed and negative except as noted in the HPI.   Past Surgical History:  Procedure Laterality Date  . ABDOMINAL HYSTERECTOMY    . Bartholins gland cyst resection    . BREAST EXCISIONAL BIOPSY Right    benign  . CERVICAL DISC SURGERY     C2  . CHOLECYSTECTOMY    . SALIVARY GLAND SURGERY       Family History  Problem Relation Age of Onset  . Stroke Mother   . Lung cancer Mother   . Seizures Neg Hx      Social History   Socioeconomic History  . Marital status: Married    Spouse name: Not on file  . Number of children: 3  . Years of education: Not on file  . Highest education level: Not on file  Occupational History  . Occupation: retired  Scientific laboratory technician  .  Financial resource strain: Not on file  . Food insecurity    Worry: Not on file    Inability: Not on file  . Transportation needs    Medical: Not on file    Non-medical: Not on file  Tobacco Use  . Smoking status: Never Smoker  . Smokeless tobacco: Never Used  Substance and Sexual Activity  . Alcohol use: No  . Drug use: No  . Sexual activity: Not on file  Lifestyle  . Physical activity    Days per week: Not on file    Minutes per session: Not on file  . Stress: Not on file  Relationships  . Social Herbalist on phone: Not on file    Gets together: Not on file    Attends religious service: Not on file    Active member of club or organization: Not on file    Attends meetings of clubs or organizations:  Not on file    Relationship status: Not on file  . Intimate partner violence    Fear of current or ex partner: Not on file    Emotionally abused: Not on file    Physically abused: Not on file    Forced sexual activity: Not on file  Other Topics Concern  . Not on file  Social History Narrative  . Not on file     BP 108/64   Pulse 60   Ht 5\' 7"  (1.702 m)   Wt 157 lb (71.2 kg)   SpO2 97%   BMI 24.59 kg/m   Physical Exam:  Well appearing NAD HEENT: Unremarkable Neck:  No JVD, no thyromegally Lymphatics:  No adenopathy Back:  No CVA tenderness Lungs:  Clear HEART:  Regular rate rhythm, no murmurs, no rubs, no clicks Abd:  soft, positive bowel sounds, no organomegally, no rebound, no guarding Ext:  2 plus pulses, no edema, no cyanosis, no clubbing Skin:  No rashes no nodules Neuro:  CN II through XII intact, motor grossly intact  EKG - nsr   Assess/Plan: 1. PAF - she is maintaining NSR on low dose flecainide. She has not palpitations. We discussed her thromboembolic risk. I encouraged her to reach out to her neurologist to see if she would ever be a candidate for systemic anti-coagulation. I spent 15 minutes including 50% face to face time with this patient.  Mikle Bosworth.D.

## 2019-05-25 ENCOUNTER — Other Ambulatory Visit: Payer: Self-pay | Admitting: Family Medicine

## 2019-05-25 DIAGNOSIS — M8580 Other specified disorders of bone density and structure, unspecified site: Secondary | ICD-10-CM

## 2019-09-16 ENCOUNTER — Telehealth: Payer: Self-pay

## 2019-09-16 ENCOUNTER — Telehealth: Payer: Self-pay | Admitting: Internal Medicine

## 2019-09-16 ENCOUNTER — Other Ambulatory Visit: Payer: Self-pay

## 2019-09-16 MED ORDER — DICYCLOMINE HCL 10 MG PO CAPS: ORAL_CAPSULE | ORAL | 0 refills | Status: DC

## 2019-09-16 MED ORDER — CIPROFLOXACIN HCL 500 MG PO TABS: 500.0000 mg | ORAL_TABLET | Freq: Two times a day (BID) | ORAL | 0 refills | Status: DC

## 2019-09-16 NOTE — Telephone Encounter (Signed)
Spoke with pt and she is aware. Prescriptions sent to pharmacy. Pt scheduled to see Dr. Henrene Pastor 2/17@8 :40am. Pt aware of appt.

## 2019-09-16 NOTE — Telephone Encounter (Signed)
Office visit scheduled for patient

## 2019-09-16 NOTE — Telephone Encounter (Signed)
Pt called again inquiring about medication for her flare up. She stated that last time she had a flare up she ended up going to the ED because she waited for too long. She is asking if we could send her medication asap to her pharmacy.

## 2019-09-16 NOTE — Telephone Encounter (Signed)
Sent message to Dr. Henrene Pastor regarding patient's request for medication for possibly diverticulitis flare. Awaiting response.

## 2019-09-16 NOTE — Telephone Encounter (Signed)
Dr. Henrene Pastor is gone for the day, pt is calling back to see if antibiotics can be called in for her. Reports she started having pain in LLQ, no fever. Reports she has been hospitalized in the past with diverticulitis and she is afraid this might happen again. Pt requesting antibiotics be called in for her. Please advise as DOD.

## 2019-09-16 NOTE — Telephone Encounter (Signed)
Cipro 500 mg bid x 10 days Low residue diet until feeling better Bentyl for pain as needed Office with me or APP next week. To ER in interim if problem worsens   Thanks

## 2019-09-16 NOTE — Telephone Encounter (Signed)
Please advise 

## 2019-09-16 NOTE — Telephone Encounter (Signed)
I'm not gone. Let me review.Marland KitchenMarland Kitchen

## 2019-09-16 NOTE — Telephone Encounter (Signed)
Pt reported that she is having a diverticulitis flare and requested prescription for med to be sent to [facility].

## 2019-09-17 ENCOUNTER — Other Ambulatory Visit: Payer: Medicare Other

## 2019-09-17 ENCOUNTER — Telehealth: Payer: Self-pay | Admitting: Internal Medicine

## 2019-09-17 NOTE — Telephone Encounter (Signed)
Pt aware.

## 2019-09-17 NOTE — Telephone Encounter (Signed)
Just Cipro at this time

## 2019-09-17 NOTE — Telephone Encounter (Signed)
Pt called back this morning questioning if she was supposed to have flagyl sent to the pharmacy in addition to the cipro. She reports she has taken flagyl in the past with the cipro. Please advise.

## 2019-09-22 ENCOUNTER — Encounter: Payer: Self-pay | Admitting: Internal Medicine

## 2019-09-22 ENCOUNTER — Other Ambulatory Visit: Payer: Self-pay

## 2019-09-22 ENCOUNTER — Ambulatory Visit: Payer: Medicare PPO | Admitting: Internal Medicine

## 2019-09-22 VITALS — BP 110/70 | HR 64 | Temp 97.8°F | Ht 67.0 in | Wt 158.0 lb

## 2019-09-22 DIAGNOSIS — K5732 Diverticulitis of large intestine without perforation or abscess without bleeding: Secondary | ICD-10-CM

## 2019-09-22 MED ORDER — CIPROFLOXACIN HCL 500 MG PO TABS: 500.0000 mg | ORAL_TABLET | Freq: Two times a day (BID) | ORAL | 0 refills | Status: DC

## 2019-09-22 NOTE — Patient Instructions (Signed)
We have sent the following medications to your pharmacy for you to pick up at your convenience:  Cipro  Please call the office if you have any more problems

## 2019-09-22 NOTE — Progress Notes (Signed)
HISTORY OF PRESENT ILLNESS:  Gwendolyn Alvarado is a 74 y.o. female, wife of Ed and mother of [place] physician, who presents today after having had a bout of probable diverticulitis.  She has a history of chronic GI problems for which she was initially evaluated upon self-referral August 2020.  Issues include diarrhea predominant irritable bowel syndrome and recurrent diverticulitis.  Previous colonoscopy in May 2018 was normal except for diverticulosis.  She was last seen in this office September 2020.  See that dictation for details.  Irritable bowel was improved with fiber and antispasmodics.  These were continued.  She was told to contact the office for possible flare of diverticulitis which she did last week.  She was placed on ciprofloxacin 500 mg twice daily.  She presents today for follow-up.  She is pleased to report that her pain is gone.  No fevers.  She is on a modified diet.  She does still describe a little bit of soreness.  Some gas with bloating.  Still describes her episodes of urgency with loose stools about once per week.  Previous CT scan documenting diverticulitis 2017.  Last colonoscopy with Dr. Chapman Fitch [facility] 2018.  REVIEW OF SYSTEMS:  All non-GI ROS negative unless otherwise stated in the HPI except for irregular heartbeat  Past Medical History:  Diagnosis Date  . Angioma cavernosum   . Atrial fibrillation ([place])   . Colon polyps   . Diverticulitis   . Diverticulosis   . HTN (hypertension)   . Hypotension 06/23/2016  . Irritable bowel syndrome (IBS)   . Seizure disorder [facility])     Past Surgical History:  Procedure Laterality Date  . ABDOMINAL HYSTERECTOMY    . Bartholins gland cyst resection    . BREAST EXCISIONAL BIOPSY Right    benign  . CERVICAL DISC SURGERY     C2  . CHOLECYSTECTOMY    . SALIVARY GLAND SURGERY      Social History Chella Gaston  reports that she has never smoked. She has never used smokeless tobacco. She reports that she does not drink alcohol or  use drugs.  family history includes Lung cancer in her mother; Stroke in her mother.  Allergies  Allergen Reactions  . Carbamazepine Hives and Other (See Comments)    Blisters similar to [employer] Syndrome  . Dilantin [Phenytoin Sodium Extended] Hives and Other (See Comments)    Blisters similar to Remo Lipps Johnston's Syndrome Blisters  . Doxycycline Nausea And Vomiting and Other (See Comments)    Made her sick for about 6 weeks & lost 30 lbs Made her sick for about 6 weeks & lost 30 lbs  . Phenytoin Sodium Extended Other (See Comments) and Hives    Other reaction(s): Hives blisters  Blisters similar to [employer] Syndrome  . Vicodin [Hydrocodone-Acetaminophen] Other (See Comments) and Nausea And Vomiting  . Phenytoin Sodium   . Hydrocodone-Acetaminophen Nausea And Vomiting  . Penicillins Itching and Rash    Other reaction(s): Itching       PHYSICAL EXAMINATION: Vital signs: BP 110/70   Pulse 64   Temp 97.8 F (36.6 C)   Ht 5\' 7"  (1.702 m)   Wt 158 lb (71.7 kg)   BMI 24.75 kg/m   Constitutional: generally well-appearing, no acute distress Psychiatric: alert and oriented x3, cooperative Eyes: extraocular movements intact, anicteric, conjunctiva pink Mouth: oral pharynx moist, no lesions Neck: supple no lymphadenopathy Cardiovascular: heart regular rate and rhythm, no murmur Lungs: clear to auscultation bilaterally Abdomen: soft, nontender,  nondistended, no obvious ascites, no peritoneal signs, normal bowel sounds, no organomegaly Rectal: Deferred Extremities: no clubbing, cyanosis, or lower extremity edema bilaterally Skin: no lesions on visible extremities Neuro: No focal deficits.  Cranial nerves intact  ASSESSMENT:  1.  Acute diverticulitis.  Improved with ciprofloxacin 2.  Previous colonoscopy with diverticulosis 2018 3.  Diarrhea predominant irritable bowel syndrome   PLAN:  1.  Complete 10-day course of ciprofloxacin 2.  Given a refill of  ciprofloxacin to have on hand should she need this for a flare and not have quick access to medical assistance.  She understands to contact the office upon initiating Cipro therapy for documentation and follow-up purposes. 3.  Advance diet as directed 4.  Continue with fiber and antispasmodics 5.  Routine GI follow-up in 1 year.  Sooner if needed A total of 30 minutes of time was required preparing to see the patient, reviewing test, obtaining history, performing appropriate medical physical examination, counseling the patient regarding her above listed issues, ordering and reviewing medications, and documenting clinical information in the health record

## 2019-10-28 DIAGNOSIS — M67431 Ganglion, right wrist: Secondary | ICD-10-CM | POA: Insufficient documentation

## 2019-10-28 DIAGNOSIS — M79641 Pain in right hand: Secondary | ICD-10-CM | POA: Insufficient documentation

## 2019-12-06 ENCOUNTER — Ambulatory Visit
Admission: RE | Admit: 2019-12-06 | Discharge: 2019-12-06 | Disposition: A | Discharge status: Home / Self Care | Payer: Medicare PPO | Source: Ambulatory Visit | Attending: Family Medicine | Admitting: Family Medicine

## 2019-12-06 ENCOUNTER — Other Ambulatory Visit: Payer: Self-pay

## 2019-12-06 DIAGNOSIS — M8580 Other specified disorders of bone density and structure, unspecified site: Secondary | ICD-10-CM

## 2020-03-07 ENCOUNTER — Telehealth: Payer: Self-pay | Admitting: Internal Medicine

## 2020-03-07 MED ORDER — DEXILANT 60 MG PO CPDR: 60.0000 mg | DELAYED_RELEASE_CAPSULE | Freq: Every day | ORAL | 6 refills | Status: DC

## 2020-03-07 NOTE — Telephone Encounter (Signed)
Spoke with patient and told her I send her Dexilant to pharmacy; patient acknowledged

## 2020-05-02 ENCOUNTER — Telehealth: Payer: Self-pay | Admitting: Internal Medicine

## 2020-05-02 DIAGNOSIS — I10 Essential (primary) hypertension: Secondary | ICD-10-CM

## 2020-05-02 MED ORDER — METOPROLOL SUCCINATE ER 25 MG PO TB24: 25.0000 mg | ORAL_TABLET | Freq: Every day | ORAL | 0 refills | Status: DC

## 2020-05-02 NOTE — Telephone Encounter (Signed)
*  STAT* If patient is at the pharmacy, call can be transferred to refill team.   1. Which medications need to be refilled? (please list name of each medication and dose if known)   metoprolol succinate (TOPROL-XL) 25 MG 24 hr tablet     2. Which pharmacy/location (including street and city if local pharmacy) is medication to be sent to? [id number] - [place], [place] - [address]  3. Do they need a 30 day or 90 day supply? 90 day supply

## 2020-05-02 NOTE — Telephone Encounter (Signed)
Pt's medication was sent to pt's pharmacy as requested. Confirmation received.  °

## 2020-06-22 ENCOUNTER — Telehealth: Payer: Self-pay | Admitting: Internal Medicine

## 2020-06-22 MED ORDER — DICYCLOMINE HCL 10 MG PO CAPS: ORAL_CAPSULE | ORAL | 2 refills | Status: DC

## 2020-06-22 NOTE — Telephone Encounter (Signed)
reflled Bentyl

## 2020-06-22 NOTE — Telephone Encounter (Signed)
Patient needs to refill dicyclomine 10 mg. Please send it to [facility] in [place].

## 2020-06-27 ENCOUNTER — Telehealth: Payer: Self-pay | Admitting: Internal Medicine

## 2020-06-27 NOTE — Telephone Encounter (Signed)
Pt states she feels she is having a diverticulitis flare. Reports she had a cipro script on hand that Dr. Henrene Pastor gave her in case she needed it. Reports she started feeling bad and started the Cipro yesterday. Reports the bottom of her stomach is hurting, she does not have a fever. She states she feels bad and with the holiday coming she wanted to make sure Dr. Henrene Pastor didn't want her to do anything different. States she feels like she did when she was hospitalized except her stomach does not hurt quite as bad and she is not having chills. Please advise.

## 2020-06-27 NOTE — Telephone Encounter (Signed)
Since not responding to Cipro and feeling bad, it would be best for her to go to ER for thorough evaluation.

## 2020-06-27 NOTE — Telephone Encounter (Signed)
Spoke with pt and she is aware. Pt wanted to know if she should take the cipro another day to see if she feels better. Reviewed Dr. Blanch Media recommendations again with her and she verbalized understanding.

## 2020-07-11 ENCOUNTER — Ambulatory Visit: Payer: Medicare PPO | Admitting: Internal Medicine

## 2020-07-11 ENCOUNTER — Encounter: Payer: Self-pay | Admitting: Internal Medicine

## 2020-07-11 ENCOUNTER — Other Ambulatory Visit: Payer: Self-pay

## 2020-07-11 VITALS — BP 132/80 | HR 60 | Ht 67.0 in | Wt 157.4 lb

## 2020-07-11 DIAGNOSIS — I480 Paroxysmal atrial fibrillation: Secondary | ICD-10-CM | POA: Diagnosis not present

## 2020-07-11 NOTE — Progress Notes (Signed)
HPI Gwendolyn Alvarado returns today for follow-up. She is a 74 year old woman with paroxysmal atrial fibrillation who has been nicely controlled with flecainide and beta blocker therapy. Sheis noton systemic anticoagulation for her atrial fibrillation with her h/o cavernous hemangioma.She denies chest pain or shortness of breath. She has had no symptomatic atrial fibrillation on medical therapy.  Allergies  Allergen Reactions  . Carbamazepine Hives and Other (See Comments)    Blisters similar to [employer] Syndrome  . Dilantin [Phenytoin Sodium Extended] Hives and Other (See Comments)    Blisters similar to Remo Lipp[employer] Johnston's Syndrome Blisters  . Doxycycline Nausea And Vomiting and Other (See Comments)    Made her sick for about 6 weeks & lost 30 lbs Made her sick for about 6 weeks & lost 30 lbs  . Phenytoin Sodium Extended Other (See Comments) and Hives    Other reaction(s): Hives blisters  Blisters similar to [employer] Syndrome  . Vicodin [Hydrocodone-Acetaminophen] Other (See Comments) and Nausea And Vomiting  . Phenytoin Sodium   . Hydrocodone-Acetaminophen Nausea And Vomiting  . Penicillins Itching and Rash    Other reaction(s): Itching     Current Outpatient Medications  Medication Sig Dispense Refill  . DEXILANT 60 MG capsule Take 1 capsule (60 mg total) by mouth daily. 30 capsule 6  . diclofenac sodium (VOLTAREN) 1 % GEL Apply 2-4 g topically 3 (three) times daily as needed.    . dicyclomine (BENTYL) 10 MG capsule Take 1-2 capsules every 4-6 hours as needed 60 capsule 2  . flecainide (TAMBOCOR) 50 MG tablet TAKE 1 TABLET(50 MG) BY MOUTH TWICE DAILY 180 tablet 3  . Lacosamide (VIMPAT) 150 MG TABS Take 1 tablet by mouth 2 (two) times daily.      . metoprolol succinate (TOPROL-XL) 25 MG 24 hr tablet Take 1 tablet (25 mg total) by mouth daily. Please keep upcoming appt in December with Dr. Lovena Le before anymore refills. Thank you 90 tablet 0   No current  facility-administered medications for this visit.     Past Medical History:  Diagnosis Date  . Angioma cavernosum   . Atrial fibrillation ([place])   . Colon polyps   . Diverticulitis   . Diverticulosis   . HTN (hypertension)   . Hypotension 06/23/2016  . Irritable bowel syndrome (IBS)   . Seizure disorder ([place])     ROS:   All systems reviewed and negative except as noted in the HPI.   Past Surgical History:  Procedure Laterality Date  . ABDOMINAL HYSTERECTOMY    . Bartholins gland cyst resection    . BREAST EXCISIONAL BIOPSY Right    benign  . CERVICAL DISC SURGERY     C2  . CHOLECYSTECTOMY    . SALIVARY GLAND SURGERY       Family History  Problem Relation Age of Onset  . Stroke Mother   . Lung cancer Mother   . Seizures Neg Hx      Social History   Socioeconomic History  . Marital status: Married    Spouse name: Not on file  . Number of children: 3  . Years of education: Not on file  . Highest education level: Not on file  Occupational History  . Occupation: retired  Tobacco Use  . Smoking status: Never Smoker  . Smokeless tobacco: Never Used  Vaping Use  . Vaping Use: Never used  Substance and Sexual Activity  . Alcohol use: No  . Drug use: No  . Sexual  activity: Not on file  Other Topics Concern  . Not on file  Social History Narrative  . Not on file   Social Determinants of Health   Financial Resource Strain:   . Difficulty of Paying Living Expenses: Not on file  Food Insecurity:   . Worried About Charity fundraiser in the Last Year: Not on file  . Ran Out of Food in the Last Year: Not on file  Transportation Needs:   . Lack of Transportation (Medical): Not on file  . Lack of Transportation (Non-Medical): Not on file  Physical Activity:   . Days of Exercise per Week: Not on file  . Minutes of Exercise per Session: Not on file  Stress:   . Feeling of Stress : Not on file  Social Connections:   . Frequency of Communication with  Friends and Family: Not on file  . Frequency of Social Gatherings with Friends and Family: Not on file  . Attends Religious Services: Not on file  . Active Member of Clubs or Organizations: Not on file  . Attends Archivist Meetings: Not on file  . Marital Status: Not on file  Intimate Partner Violence:   . Fear of Current or Ex-Partner: Not on file  . Emotionally Abused: Not on file  . Physically Abused: Not on file  . Sexually Abused: Not on file     BP 132/80   Pulse 60   Ht 5\' 7"  (1.702 m)   Wt 157 lb 6.4 oz (71.4 kg)   SpO2 95%   BMI 24.65 kg/m   Physical Exam:  Well appearing NAD HEENT: Unremarkable Neck:  No JVD, no thyromegally Lymphatics:  No adenopathy Back:  No CVA tenderness Lungs:  Clear with no wheezes HEART:  Regular rate rhythm, no murmurs, no rubs, no clicks Abd:  soft, positive bowel sounds, no organomegally, no rebound, no guarding Ext:  2 plus pulses, no edema, no cyanosis, no clubbing Skin:  No rashes no nodules Neuro:  CN II through XII intact, motor grossly intact  EKG - nsr   Assess/Plan: 1. PAF - she is maintaining NSR very nicely on flecainide.  2. Coags - she is not on systemic anti-coagulation and is at risk for stroke, CHADSVASC 4. I have offered her referral to consider watchman and she is reflecting and will let us know if she would like to see Dr. Quentin Ore. 3. HTN -her bp is fairly well controlled. No change in meds. 4. Cavernous hemangioma - She is asymptomatic. She has not had any bleeding. She is not a long term candidate for anti-coagulation.   Gwendolyn Sinner [place] Goodgame,MD

## 2020-07-11 NOTE — Patient Instructions (Signed)

## 2020-07-12 ENCOUNTER — Telehealth: Payer: Self-pay | Admitting: Internal Medicine

## 2020-07-12 NOTE — Telephone Encounter (Signed)
Left message for pt to call back  °

## 2020-07-13 NOTE — Telephone Encounter (Signed)
Pts daughter called back and states that her mother was treated for diverticulitis before Thanksgiving and she is still having some issues with LLQ abd pain. She is requesting her mother be seen. Pt scheduled to see Tye Savoy NP tomorrow at 10:30am, daughter aware of appt.

## 2020-07-14 ENCOUNTER — Encounter: Payer: Self-pay | Admitting: Nurse Practitioner

## 2020-07-14 ENCOUNTER — Ambulatory Visit: Payer: Medicare PPO | Admitting: Nurse Practitioner

## 2020-07-14 VITALS — BP 130/80 | HR 72 | Ht 64.0 in | Wt 160.4 lb

## 2020-07-14 DIAGNOSIS — Z8719 Personal history of other diseases of the digestive system: Secondary | ICD-10-CM | POA: Diagnosis not present

## 2020-07-14 DIAGNOSIS — K582 Mixed irritable bowel syndrome: Secondary | ICD-10-CM | POA: Diagnosis not present

## 2020-07-14 MED ORDER — CIPROFLOXACIN HCL 500 MG PO TABS: 500.0000 mg | ORAL_TABLET | Freq: Two times a day (BID) | ORAL | 0 refills | Status: AC

## 2020-07-14 MED ORDER — DICYCLOMINE HCL 10 MG PO CAPS: ORAL_CAPSULE | ORAL | 2 refills | Status: DC

## 2020-07-14 NOTE — Progress Notes (Signed)
ASSESSMENT AND PLAN    # 74 yo female with history of recurrent diverticulitis. Recently completed course of Cipro with resolution of symptoms.  --She travels and worried about getting recurrent diverticulitis without quick access to medical attention.  As before we will give her a prescription for Cipro to keep on hand.  She had the last prescription of Cipro for nearly a year before needing it.   # IBS. Frequent episodes of abdominal cramping followed by multiple bowel movements. Stools generally start our hard then become increasingly loose with each subsequent BM. Having similar episodes over last several days except this time stools have remain solid despite multiple BMs in a day.  She is understandably concerned about being out in public given the frequency of IBS symptoms.  --She is nontender on exam.  Nontoxic-appearing --Given that stools generally start out hard and then turn loose it sounds like she may be constipated in general.  Patient started taking Metamucil two days ago.  I have recommended she continue daily Metamucil to try and regulate bowel movements with the understanding that fiber could make things better or worse. --I have asked her to take 1 dose of dicyclomine every morning to try and prevent some of these episodes from occurring.  --Patient will let us know if things are not improving,  or certainly if they get worse.  HISTORY OF PRESENT ILLNESS     Primary Gastroenterologist :Scarlette Shorts, MD   Chief Complaint : abdominal pain  Gwendolyn Alvarado is a 74 y.o. female with PMH / [place] significant for,  but not necessarily limited to: PAF, osteopenia, diverticulitis, hypertension, diarrhea predominant IBS, GERD, with history of cholecystectomy.   Patient was last seen February 2021.  She was getting over a recurrent bout of diverticulitis then. She travels and was concerned about possibility of getting recurrent diverticulitis  without quick access to medical attention.  She was given a prescription for Cipro to keep on hand.  Patient has IBS and is very cautious about not starting antibiotics prematurely.  She did well for several months after last visit but around Thanksgiving had recurrent diverticulitis symptoms and completed the 10-day course of Cipro with resolution of symptoms.  Then, several days later she developed generalized lower abdominal cramping followed by multiple solid BMs. She has had several recurrent episodes over the last several days. She feels like this is IBS related except that typically with IBS flares her stools eventually become loose. She takes Hardin Negus probiotic and started Metamucil a couple of days ago. She is scared to be out in public for fear of having this recurrent lower abdominal cramping followed by multiple BMs.   Previous Endoscopic Evaluations / Pertinent Studies:   May 2018 colonoscopy at outside facility. --Complete exam, quality of prep not stated but views were "good" --Moderate diverticulosis  Past Medical History:  Diagnosis Date  . Angioma cavernosum   . Atrial fibrillation ([place])   . Colon polyps   . Diverticulitis   . Diverticulosis   . HTN (hypertension)   . Hypotension 06/23/2016  . Irritable bowel syndrome (IBS)   . Seizure disorder ([place])     Current Medications, Allergies, Past Surgical History, Family History and Social History were reviewed in [employer] record.   Current Outpatient Medications  Medication Sig Dispense Refill  . DEXILANT 60 MG capsule Take 1 capsule (60 mg total) by mouth daily. 30 capsule 6  . diclofenac sodium (VOLTAREN) 1 % GEL Apply 2-4  g topically 3 (three) times daily as needed.    . dicyclomine (BENTYL) 10 MG capsule Take 1-2 capsules every 4-6 hours as needed 60 capsule 2  . flecainide (TAMBOCOR) 50 MG tablet TAKE 1 TABLET(50 MG) BY MOUTH TWICE DAILY 180 tablet 3  . Lacosamide (VIMPAT) 150 MG TABS Take 1 tablet by mouth 2 (two) times daily.      .  metoprolol succinate (TOPROL-XL) 25 MG 24 hr tablet Take 1 tablet (25 mg total) by mouth daily. Please keep upcoming appt in December with Dr. Lovena Le before anymore refills. Thank you 90 tablet 0   No current facility-administered medications for this visit.    Review of Systems: No chest pain. No shortness of breath. No urinary complaints.   PHYSICAL EXAM :    Wt Readings from Last 3 Encounters:  07/11/20 157 lb 6.4 oz (71.4 kg)  09/22/19 158 lb (71.7 kg)  05/04/19 157 lb (71.2 kg)    BP 130/80 (BP Location: Left Arm, Patient Position: Sitting, Cuff Size: Normal)   Pulse 72   Ht 5\' 4"  (1.626 m) Comment: height measured agian without shoes  Wt 160 lb 6 oz (72.7 kg)   BMI 27.53 kg/m  Constitutional:  Pleasant female in no acute distress. Psychiatric: Normal mood and affect. Behavior is normal. EENT: Pupils normal.  Conjunctivae are normal. No scleral icterus. Neck supple.  Cardiovascular: Normal rate, regular rhythm. No edema Pulmonary/chest: Effort normal and breath sounds normal. No wheezing, rales or rhonchi. Abdominal: Soft, nondistended, nontender. Bowel sounds active throughout. There are no masses palpable. No hepatomegaly. Neurological: Alert and oriented to person place and time. Skin: Skin is warm and dry. No rashes noted.  I spent 30 minutes total reviewing records, obtaining history, performing exam, counseling patient and documenting visit / findings.   Tye Savoy, NP  07/14/2020, 9:03 AM

## 2020-07-14 NOTE — Patient Instructions (Signed)
If you are age 75 or older, your body mass index should be between 23-30. Your Body mass index is 27.53 kg/m. If this is out of the aforementioned range listed, please consider follow up with your Primary Care Provider.  If you are age 17 or younger, your body mass index should be between 19-25. Your Body mass index is 27.53 kg/m. If this is out of the aformentioned range listed, please consider follow up with your Primary Care Provider.   A refill on Cipro 500 mg has been sent to your pharmacy.  Continue Metamucil daily.  Continue Dicyclomine 1 gram as previously  Directed.  Follow up as needed for now.  Thank you for entrusting me with your care and choosing [facility].  Tye Savoy, NP

## 2020-07-15 NOTE — Progress Notes (Signed)
Noted  

## 2020-07-18 ENCOUNTER — Other Ambulatory Visit: Payer: Self-pay | Admitting: Internal Medicine

## 2020-07-18 DIAGNOSIS — I10 Essential (primary) hypertension: Secondary | ICD-10-CM

## 2020-07-18 MED ORDER — METOPROLOL SUCCINATE ER 25 MG PO TB24: 25.0000 mg | ORAL_TABLET | Freq: Every day | ORAL | 3 refills | Status: DC

## 2020-07-18 MED ORDER — FLECAINIDE ACETATE 50 MG PO TABS: ORAL_TABLET | ORAL | 3 refills | Status: DC

## 2020-07-18 NOTE — Telephone Encounter (Signed)
Pt's medications were sent to pt's pharmacy as requested. Confirmation received.  

## 2020-08-21 ENCOUNTER — Ambulatory Visit: Payer: Medicare PPO | Admitting: Internal Medicine

## 2020-09-21 ENCOUNTER — Other Ambulatory Visit: Payer: Self-pay | Admitting: Family Medicine

## 2020-09-21 DIAGNOSIS — Z1231 Encounter for screening mammogram for malignant neoplasm of breast: Secondary | ICD-10-CM

## 2020-09-23 ENCOUNTER — Other Ambulatory Visit: Payer: Self-pay

## 2020-09-23 ENCOUNTER — Ambulatory Visit
Admission: RE | Admit: 2020-09-23 | Discharge: 2020-09-23 | Disposition: A | Discharge status: Home / Self Care | Payer: Medicare PPO | Source: Ambulatory Visit | Attending: Family Medicine | Admitting: Family Medicine

## 2020-09-23 DIAGNOSIS — Z1231 Encounter for screening mammogram for malignant neoplasm of breast: Secondary | ICD-10-CM

## 2020-09-27 ENCOUNTER — Encounter: Payer: Self-pay | Admitting: Internal Medicine

## 2020-09-27 ENCOUNTER — Ambulatory Visit: Payer: Medicare PPO | Admitting: Internal Medicine

## 2020-09-27 VITALS — BP 134/70 | HR 64 | Ht 64.0 in | Wt 164.4 lb

## 2020-09-27 DIAGNOSIS — Z8719 Personal history of other diseases of the digestive system: Secondary | ICD-10-CM

## 2020-09-27 DIAGNOSIS — K589 Irritable bowel syndrome without diarrhea: Secondary | ICD-10-CM | POA: Diagnosis not present

## 2020-09-27 DIAGNOSIS — K219 Gastro-esophageal reflux disease without esophagitis: Secondary | ICD-10-CM | POA: Diagnosis not present

## 2020-09-27 MED ORDER — DEXILANT 60 MG PO CPDR: 60.0000 mg | DELAYED_RELEASE_CAPSULE | Freq: Every day | ORAL | 3 refills | Status: DC

## 2020-09-27 NOTE — Patient Instructions (Signed)
Take 2 tablespoons of Metamucil daily.  We have sent the following medications to your pharmacy for you to pick up at your convenience:  Dexilant  Please follow up in one year

## 2020-09-27 NOTE — Progress Notes (Signed)
HISTORY OF PRESENT ILLNESS:  Gwendolyn Alvarado is a 75 y.o. female, voices Gwendolyn Alvarado and Gwendolyn Alvarado of Gwendolyn Alvarado physician, with a history of diverticulitis and IBS (diarrhea predominant).  She presents today for follow-up regarding her IBS.  She also has a history of GERD for which she takes Dexilant.  She was last evaluated in this office by the GI nurse practitioner July 14, 2020 regarding diverticulitis and IBS.  See that dictation for details.  She was provided a prescription for ciprofloxacin should she need it while traveling.  She has had no further issues with diverticular type pain.  She does continue with IBS type symptoms.  She does think things have been a little better in recent months she takes a teaspoon of Metamucil daily.  For abdominal pain, dicyclomine helps.  Her reflux symptoms are controlled on Dexilant.  She does request medication refill.  REVIEW OF SYSTEMS:  All non-GI ROS negative unless otherwise stated in the HPI.  Past Medical History:  Diagnosis Date  . Angioma cavernosum   . Atrial fibrillation ([place])   . Colon polyps   . Diverticulitis   . Diverticulosis   . HTN (hypertension)   . Hypotension 06/23/2016  . Irritable bowel syndrome (IBS)   . Seizure disorder [facility])     Past Surgical History:  Procedure Laterality Date  . ABDOMINAL HYSTERECTOMY    . Bartholins gland cyst resection    . BREAST EXCISIONAL BIOPSY Right    benign  . CERVICAL DISC SURGERY     C2  . CHOLECYSTECTOMY    . SALIVARY GLAND SURGERY      Social History Gwendolyn Alvarado  reports that she has never smoked. She has never used smokeless tobacco. She reports that she does not drink alcohol and does not use drugs.  family history includes Lung cancer in her Gwendolyn Alvarado; Stroke in her Gwendolyn Alvarado.  Allergies  Allergen Reactions  . 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 Dilantin [Phenytoin Sodium Extended] Hives and Other (See Comments)    Blisters  similar to Remo Lipps Johnston's Syndrome Blisters  . Doxycycline Nausea And Vomiting and Other (See Comments)    Made her sick for about 6 weeks & lost 30 lbs Made her sick for about 6 weeks & lost 30 lbs  . Phenytoin Sodium Extended Other (See Comments) and Hives    Other reaction(s): Hives blisters  Blisters similar to [employer] Syndrome  . Vicodin [Hydrocodone-Acetaminophen] Other (See Comments) and Nausea And Vomiting  . Phenytoin Sodium   . Hydrocodone-Acetaminophen Nausea And Vomiting  . Penicillins Itching and Rash    Other reaction(s): Itching       PHYSICAL EXAMINATION: Vital signs: BP 134/70 (BP Location: Left Arm, Patient Position: Sitting, Cuff Size: Normal)   Pulse 64   Ht 5\' 4"  (1.626 m)   Wt 164 lb 6 oz (74.6 kg)   BMI 28.21 kg/m   Constitutional: generally well-appearing, no acute distress Psychiatric: alert and oriented x3, cooperative Eyes: extraocular movements intact, anicteric, conjunctiva pink Mouth: oral pharynx moist, no lesions Neck: supple no lymphadenopathy Cardiovascular: heart regular rate and rhythm, no murmur Lungs: clear to auscultation bilaterally Abdomen: soft, nontender, nondistended, no obvious ascites, no peritoneal signs, normal bowel sounds, no organomegaly Rectal: Omitted Extremities: no lower extremity edema bilaterally Skin: no lesions on visible extremities Neuro: No focal deficits.  Cranial nerves intact  ASSESSMENT:  1.  History of recurrent diverticulitis.  CT confirmed November 2017.  Recently empirically treated with resolution of symptoms.  Currently asymptomatic.  Last colonoscopy Dec 12, 2016 [facility] gastroenterology) with diverticulosis.  Otherwise normal. 2.  Diarrhea predominant IBS.  Ongoing 3.  GERD.  Managed with Dexilant   PLAN:  1.  The patient has a ciprofloxacin prescription on hand should she develop diverticular symptoms.  She has been instructed to contact the office should she feel the need to  initiate therapy. 2.  Increase Metamucil from 1 teaspoon daily to 2 tablespoons daily.  Hopefully this will improve bowel consistency. 3.  Continue with dicyclomine as needed 4.  Reflux precautions 5.  Refill Dexilant prescription. 6.  Serology screening for celiac disease.  Patient wishes to hold off at this time as she is a hard blood draw. 7.  Routine GI follow-up 1 year.  Sooner if needed

## 2020-10-25 ENCOUNTER — Telehealth: Payer: Self-pay | Admitting: Internal Medicine

## 2020-10-25 NOTE — Telephone Encounter (Signed)
Spoke with pt and she is aware.

## 2020-10-25 NOTE — Telephone Encounter (Signed)
1.  Finish a course of ciprofloxacin 500 mg twice daily for 7 days 2.  Okay to continue with fiber supplementation 3.  Give [place] an update after completing a course of ciprofloxacin.  Contact the office sooner if problems happen to worsen Thanks

## 2020-10-25 NOTE — Telephone Encounter (Signed)
Patient is requesting to speak with nurse regarding Diverticulitis

## 2020-10-25 NOTE — Telephone Encounter (Signed)
Pt states she is having another bout of diverticulitis. States she started having pain in her LLQ and started taking cipro that she had yesterday. Reports she feels better today, the pain is better. She states she is still sore in the area. She has no fever. Pt reports she was told to take metamucil BID, she isn't sure if she should continue this and also wants to know if Dr. Henrene Pastor thinks she  needs to be seen. Please advise.

## 2020-11-03 NOTE — Telephone Encounter (Signed)
Pt called back to let Dr. Henrene Pastor know her abdominal pain is better since finishing the cipro but she has not had a BM in 3 days. Discussed with her she can take miralax for the constipation 1-3 doses a day to have BM. She verbalized understanding.

## 2020-11-03 NOTE — Telephone Encounter (Signed)
Patient called in with an update about finishing the ciprofloxacin. Would not say directly what it was. Also want information about not having bowel movement in a couple days. She would like a call back.

## 2020-11-08 ENCOUNTER — Emergency Department (HOSPITAL_BASED_OUTPATIENT_CLINIC_OR_DEPARTMENT_OTHER): Payer: Medicare PPO

## 2020-11-08 ENCOUNTER — Emergency Department (HOSPITAL_BASED_OUTPATIENT_CLINIC_OR_DEPARTMENT_OTHER)
Admission: EM | Admit: 2020-11-08 | Discharge: 2020-11-08 | Disposition: A | Discharge status: Home / Self Care | Payer: Medicare PPO | Attending: Emergency Medicine | Admitting: Emergency Medicine

## 2020-11-08 ENCOUNTER — Encounter (HOSPITAL_BASED_OUTPATIENT_CLINIC_OR_DEPARTMENT_OTHER): Payer: Self-pay | Admitting: Emergency Medicine

## 2020-11-08 ENCOUNTER — Other Ambulatory Visit: Payer: Self-pay

## 2020-11-08 DIAGNOSIS — R1032 Left lower quadrant pain: Secondary | ICD-10-CM | POA: Diagnosis present

## 2020-11-08 DIAGNOSIS — Z79899 Other long term (current) drug therapy: Secondary | ICD-10-CM | POA: Diagnosis not present

## 2020-11-08 DIAGNOSIS — K5900 Constipation, unspecified: Secondary | ICD-10-CM | POA: Diagnosis not present

## 2020-11-08 DIAGNOSIS — I10 Essential (primary) hypertension: Secondary | ICD-10-CM | POA: Diagnosis not present

## 2020-11-08 DIAGNOSIS — R1084 Generalized abdominal pain: Secondary | ICD-10-CM

## 2020-11-08 LAB — CBC WITH DIFFERENTIAL/PLATELET
Abs Immature Granulocytes: 0.06 10*3/uL (ref 0.00–0.07)
Basophils Absolute: 0 10*3/uL (ref 0.0–0.1)
Basophils Relative: 0 %
Eosinophils Absolute: 0.1 10*3/uL (ref 0.0–0.5)
Eosinophils Relative: 1 %
HCT: 45.7 % (ref 36.0–46.0)
Hemoglobin: 15.4 g/dL — ABNORMAL HIGH (ref 12.0–15.0)
Immature Granulocytes: 0 %
Lymphocytes Relative: 7 %
Lymphs Abs: 1 10*3/uL (ref 0.7–4.0)
MCH: 31.6 pg (ref 26.0–34.0)
MCHC: 33.7 g/dL (ref 30.0–36.0)
MCV: 93.8 fL (ref 80.0–100.0)
Monocytes Absolute: 0.6 10*3/uL (ref 0.1–1.0)
Monocytes Relative: 4 %
Neutro Abs: 13.3 10*3/uL — ABNORMAL HIGH (ref 1.7–7.7)
Neutrophils Relative %: 88 %
Platelets: 253 10*3/uL (ref 150–400)
RBC: 4.87 MIL/uL (ref 3.87–5.11)
RDW: 13.3 % (ref 11.5–15.5)
WBC: 15.1 10*3/uL — ABNORMAL HIGH (ref 4.0–10.5)
nRBC: 0 % (ref 0.0–0.2)

## 2020-11-08 LAB — URINALYSIS, ROUTINE W REFLEX MICROSCOPIC
Bilirubin Urine: NEGATIVE
Glucose, UA: NEGATIVE mg/dL
Hgb urine dipstick: NEGATIVE
Ketones, ur: NEGATIVE mg/dL
Leukocytes,Ua: NEGATIVE
Nitrite: NEGATIVE
Protein, ur: NEGATIVE mg/dL
Specific Gravity, Urine: 1.017 (ref 1.005–1.030)
pH: 5 (ref 5.0–8.0)

## 2020-11-08 LAB — COMPREHENSIVE METABOLIC PANEL
ALT: 15 U/L (ref 0–44)
AST: 15 U/L (ref 15–41)
Albumin: 4.3 g/dL (ref 3.5–5.0)
Alkaline Phosphatase: 71 U/L (ref 38–126)
Anion gap: 10 (ref 5–15)
BUN: 14 mg/dL (ref 8–23)
CO2: 29 mmol/L (ref 22–32)
Calcium: 10.1 mg/dL (ref 8.9–10.3)
Chloride: 101 mmol/L (ref 98–111)
Creatinine, Ser: 0.83 mg/dL (ref 0.44–1.00)
GFR, Estimated: >60 mL/min (ref >60)
Glucose, Bld: 103 mg/dL — ABNORMAL HIGH (ref 70–99)
Potassium: 4 mmol/L (ref 3.5–5.1)
Sodium: 140 mmol/L (ref 135–145)
Total Bilirubin: 1 mg/dL (ref 0.3–1.2)
Total Protein: 7.1 g/dL (ref 6.5–8.1)

## 2020-11-08 MED ORDER — MORPHINE SULFATE (PF) 2 MG/ML IV SOLN
2.0000 mg | Freq: Once | INTRAVENOUS | Status: AC
  Administered 2020-11-08: 2 mg via INTRAVENOUS
  Filled 2020-11-08: qty 1

## 2020-11-08 MED ORDER — IOHEXOL 9 MG/ML PO SOLN
500.0000 mL | Freq: Two times a day (BID) | ORAL | Status: DC | PRN
  Administered 2020-11-08 (×2): 500 mL via ORAL

## 2020-11-08 MED ORDER — MAGNESIUM CITRATE PO SOLN
1.0000 | Freq: Once | ORAL | Status: AC
  Administered 2020-11-08: 1 via ORAL
  Filled 2020-11-08: qty 296

## 2020-11-08 MED ORDER — IOHEXOL 300 MG/ML  SOLN
100.0000 mL | Freq: Once | INTRAMUSCULAR | Status: AC | PRN
  Administered 2020-11-08: 100 mL via INTRAVENOUS

## 2020-11-08 MED ORDER — ONDANSETRON HCL 4 MG/2ML IJ SOLN
4.0000 mg | Freq: Once | INTRAMUSCULAR | Status: AC
  Administered 2020-11-08: 4 mg via INTRAVENOUS
  Filled 2020-11-08: qty 2

## 2020-11-08 NOTE — ED Provider Notes (Signed)
[facility] EMERGENCY DEPT Provider Note   CSN: [id number] Arrival date & time: 11/08/20  1903     History Chief Complaint  Patient presents with  . Abdominal Pain    Gwendolyn Alvarado is a 75 y.o. female.  Patient presents to ER about 2 or 3 days of abdominal pain described as a fullness sensation and achiness and bloating sensation in the lower abdomen and left lower quadrant.  She states she had diverticulitis about 2 weeks ago that resolved 1 week ago after finishing antibiotics.  She had recurrence of pain in the last few days however.  Denies fevers denies cough.  Has been constipated for 2 days now.        Past Medical History:  Diagnosis Date  . Angioma cavernosum   . Atrial fibrillation ([place])   . Colon polyps   . Diverticulitis   . Diverticulosis   . HTN (hypertension)   . Hypotension 06/23/2016  . Irritable bowel syndrome (IBS)   . Seizure disorder [facility])     Patient Active Problem List   Diagnosis Date Noted  . Diverticulitis of large intestine without perforation or abscess without bleeding 06/23/2016  . Other hypertrophic cardiomyopathy ([place]) 11/16/2013  . Hemangioma 02/13/2009  . Essential hypertension 02/13/2009  . PAROXYSMAL ATRIAL FIBRILLATION 02/13/2009    Past Surgical History:  Procedure Laterality Date  . ABDOMINAL HYSTERECTOMY    . Bartholins gland cyst resection    . BREAST EXCISIONAL BIOPSY Right    benign  . CERVICAL DISC SURGERY     C2  . CHOLECYSTECTOMY    . SALIVARY GLAND SURGERY       OB History   No obstetric history on file.     Family History  Problem Relation Age of Onset  . Stroke Mother   . Lung cancer Mother   . Seizures Neg Hx     Social History   Tobacco Use  . Smoking status: Never Smoker  . Smokeless tobacco: Never Used  Vaping Use  . Vaping Use: Never used  Substance Use Topics  . Alcohol use: No  . Drug use: No    Home Medications Prior to Admission medications   Medication Sig Start  Date End Date Taking? Authorizing Provider  Cholecalciferol (VITAMIN D3 PO) Take 1 tablet by mouth daily.    [provider]  DEXILANT 60 MG capsule Take 1 capsule (60 mg total) by mouth daily. 09/27/20   Irene Shipper, MD  diclofenac sodium (VOLTAREN) 1 % GEL Apply 2-4 g topically 3 (three) times daily as needed. 11/03/18   [provider]  dicyclomine (BENTYL) 10 MG capsule Take 1-2 capsules every 4-6 hours as needed 07/14/20   Willia Craze, NP  flecainide (TAMBOCOR) 50 MG tablet TAKE 1 TABLET(50 MG) BY MOUTH TWICE DAILY 07/18/20   Evans Lance, MD  Lacosamide 150 MG TABS Take 1 tablet by mouth 2 (two) times daily.    [provider]  metoprolol succinate (TOPROL-XL) 25 MG 24 hr tablet Take 1 tablet (25 mg total) by mouth daily. 07/18/20   Evans Lance, MD  Probiotic Product ([place]) Take by mouth.    [provider]    Allergies    Carbamazepine, Dilantin [phenytoin sodium extended], Doxycycline, Phenytoin sodium extended, Vicodin [hydrocodone-acetaminophen], Phenytoin sodium, Hydrocodone-acetaminophen, and Penicillins  Review of Systems   Review of Systems  Constitutional: Negative for fever.  HENT: Negative for ear pain.   Eyes: Negative for pain.  Respiratory: Negative for cough.   Cardiovascular: Negative for chest pain.  Gastrointestinal: Positive for abdominal pain.  Genitourinary: Negative for flank pain.  Musculoskeletal: Negative for back pain.  Skin: Negative for rash.  Neurological: Negative for headaches.    Physical Exam Updated Vital Signs BP 131/61 (BP Location: Right Arm)   Pulse 61   Temp 98.4 F (36.9 C) (Oral)   Resp 18   Ht 5\' 6"  (1.676 m)   Wt 70.3 kg   SpO2 97%   BMI 25.02 kg/m   Physical Exam Constitutional:      General: She is not in acute distress.    Appearance: Normal appearance.  HENT:     Head: Normocephalic.     Nose: Nose normal.  Eyes:     Extraocular Movements:  Extraocular movements intact.  Cardiovascular:     Rate and Rhythm: Normal rate.  Pulmonary:     Effort: Pulmonary effort is normal.  Abdominal:     Tenderness: There is abdominal tenderness in the left lower quadrant.  Musculoskeletal:        General: Normal range of motion.     Cervical back: Normal range of motion.  Neurological:     General: No focal deficit present.     Mental Status: She is alert. Mental status is at baseline.     ED Results / Procedures / Treatments   Labs (all labs ordered are listed, but only abnormal results are displayed) Labs Reviewed  CBC WITH DIFFERENTIAL/PLATELET - Abnormal; Notable for the following components:      Result Value   WBC 15.1 (*)    Hemoglobin 15.4 (*)    Neutro Abs 13.3 (*)    All other components within normal limits  COMPREHENSIVE METABOLIC PANEL - Abnormal; Notable for the following components:   Glucose, Bld 103 (*)    All other components within normal limits  URINALYSIS, ROUTINE W REFLEX MICROSCOPIC    EKG None  Radiology CT Abdomen Pelvis W Contrast  Result Date: 11/08/2020 CLINICAL DATA:  Lower abdominal pain EXAM: CT ABDOMEN AND PELVIS WITH CONTRAST TECHNIQUE: Multidetector CT imaging of the abdomen and pelvis was performed using the standard protocol following bolus administration of intravenous contrast. CONTRAST:  169mL OMNIPAQUE IOHEXOL 300 MG/ML  SOLN COMPARISON:  06/23/2016 FINDINGS: Lower chest: No acute abnormality. Hepatobiliary: Prior cholecystectomy. Mildly prominent intrahepatic and extrahepatic biliary ducts, likely related to post cholecystectomy state. No focal hepatic abnormality. Pancreas: No focal abnormality or ductal dilatation. Spleen: No focal abnormality.  Normal size. Adrenals/Urinary Tract: Punctate nonobstructing stones in the mid and lower pole of the left kidney. Bilateral renal cysts, the largest in the right midpole measuring 3.5 cm. No hydronephrosis. 2 cm enhancing mixed density mass off the  lower pole of the right kidney with internal fat density, stable since prior study. Adrenal glands and urinary bladder unremarkable. Stomach/Bowel: Large stool burden throughout the colon. Stomach and small bowel decompressed. No bowel obstruction. Sigmoid diverticulosis. No active diverticulitis. Vascular/Lymphatic: Scattered calcifications in the aorta. No evidence of aneurysm or adenopathy. Reproductive: Prior hysterectomy.  No adnexal masses. Other: No free fluid or free air. Musculoskeletal: No acute bony abnormality. IMPRESSION: Sigmoid diverticulosis. No active diverticulitis. Large stool burden throughout the colon. Stable left nephrolithiasis.  No hydronephrosis. Stable fat containing lesion off the lower pole of the right kidney compatible with a benign process. No acute findings in the abdomen or pelvis. Electronically Signed   By: Rolm Baptise M.D.   On: 11/08/2020 22:21  Procedures Procedures   Medications Ordered in ED Medications  iohexol (OMNIPAQUE) 9 MG/ML oral solution 500 mL (500 mLs Oral Contrast Given 11/08/20 2024)  magnesium citrate solution 1 Bottle (has no administration in time range)  morphine 2 MG/ML injection 2 mg (2 mg Intravenous Given 11/08/20 1953)  ondansetron (ZOFRAN) injection 4 mg (4 mg Intravenous Given 11/08/20 1953)  iohexol (OMNIPAQUE) 300 MG/ML solution 100 mL (100 mLs Intravenous Contrast Given 11/08/20 2159)    ED Course  I have reviewed the triage vital signs and the nursing notes.  Pertinent labs & imaging results that were available during my care of the patient were reviewed by me and considered in my medical decision making (see chart for details).    MDM Rules/Calculators/A&P                          Labs show leukocytosis of 15.  Patient given IV pain medication with improvement.  CT L and pelvis pursued.  Findings noted.  Large stool burden noted.  However on rectal exam there is no stool in the vault.  Nothing decompressed rectally.  Given  oral magnesium citrate.  Patient states she has a GI doctor.  Advised her to call her doctor tomorrow for further management of her constipation.  Advised me to return for vomiting worsening symptoms worsening pain or any additional concerns.  Final Clinical Impression(s) / ED Diagnoses Final diagnoses:  Generalized abdominal pain  Constipation, unspecified constipation type    Rx / DC Orders ED Discharge Orders    None       Luna Fuse, MD 11/08/20 2304

## 2020-11-08 NOTE — Discharge Instructions (Addendum)
Call your primary care doctor or specialist as discussed in the next 2-3 days.   Return immediately back to the ER if:  Your symptoms worsen within the next 12-24 hours. You develop new symptoms such as new fevers, persistent vomiting, new pain, shortness of breath, or new weakness or numbness, or if you have any other concerns.  

## 2020-11-08 NOTE — ED Triage Notes (Signed)
  Patient comes in with lower abdominal pain that has been going on for a few days.  Patient has a hx of IBS, and diverticulitis.  Patient states she feels like she has to have a BM but unable to go.  No diarrhea, nausea, or vomiting.  Pain 8/10, pressure in lower abdomen.  Had diverticulitis flare up about two weeks ago.

## 2020-11-09 ENCOUNTER — Telehealth: Payer: Self-pay | Admitting: Internal Medicine

## 2020-11-09 NOTE — Telephone Encounter (Signed)
Pt was seen in the ER for abd pain yesterday, CT scan was done because pt recently had diverticulitis. Scan did not show diverticulitis, it did show constipation. Discussed with pt that she should drink the mag citrate that the ER gave her and she can take a daily dose of miralax.

## 2020-11-09 NOTE — Telephone Encounter (Signed)
Patient called and stated she was at the ED yesterday for abdominal pain and she would like to get advise on what she can do next.

## 2021-04-25 DIAGNOSIS — M659 Synovitis and tenosynovitis, unspecified: Secondary | ICD-10-CM | POA: Insufficient documentation

## 2021-04-25 DIAGNOSIS — M24139 Other articular cartilage disorders, unspecified wrist: Secondary | ICD-10-CM | POA: Insufficient documentation

## 2021-04-25 DIAGNOSIS — M19031 Primary osteoarthritis, right wrist: Secondary | ICD-10-CM | POA: Insufficient documentation

## 2021-06-18 DIAGNOSIS — Z981 Arthrodesis status: Secondary | ICD-10-CM | POA: Insufficient documentation

## 2021-06-18 DIAGNOSIS — M542 Cervicalgia: Secondary | ICD-10-CM | POA: Insufficient documentation

## 2021-07-20 ENCOUNTER — Other Ambulatory Visit: Payer: Self-pay

## 2021-07-20 MED ORDER — FLECAINIDE ACETATE 50 MG PO TABS: ORAL_TABLET | ORAL | 0 refills | Status: DC

## 2021-07-20 NOTE — Telephone Encounter (Signed)
Pt's medication was sent to pt's pharmacy as requested. Confirmation received.  °

## 2021-08-02 ENCOUNTER — Telehealth: Payer: Self-pay | Admitting: Internal Medicine

## 2021-08-02 MED ORDER — DEXILANT 60 MG PO CPDR: 60.0000 mg | DELAYED_RELEASE_CAPSULE | Freq: Every day | ORAL | 1 refills | Status: DC

## 2021-08-02 MED ORDER — DICYCLOMINE HCL 10 MG PO CAPS: ORAL_CAPSULE | ORAL | 3 refills | Status: DC

## 2021-08-02 NOTE — Telephone Encounter (Signed)
Refilled Bentyl and Dexilant

## 2021-08-02 NOTE — Telephone Encounter (Signed)
Patient called and needs refills for Dexilant and Dicyclomine.  If there is any issue with this, please reach out to patient.  Thank you.

## 2021-08-24 ENCOUNTER — Other Ambulatory Visit: Payer: Self-pay

## 2021-08-24 DIAGNOSIS — I10 Essential (primary) hypertension: Secondary | ICD-10-CM

## 2021-08-24 MED ORDER — METOPROLOL SUCCINATE ER 25 MG PO TB24: 25.0000 mg | ORAL_TABLET | Freq: Every day | ORAL | 0 refills | Status: DC

## 2021-08-27 DIAGNOSIS — M722 Plantar fascial fibromatosis: Secondary | ICD-10-CM | POA: Insufficient documentation

## 2021-09-03 ENCOUNTER — Ambulatory Visit: Payer: Medicare PPO | Admitting: Internal Medicine

## 2021-09-03 ENCOUNTER — Other Ambulatory Visit: Payer: Self-pay

## 2021-09-03 ENCOUNTER — Encounter: Payer: Self-pay | Admitting: Internal Medicine

## 2021-09-03 VITALS — BP 112/64 | HR 60 | Ht 65.0 in | Wt 165.0 lb

## 2021-09-03 DIAGNOSIS — I480 Paroxysmal atrial fibrillation: Secondary | ICD-10-CM

## 2021-09-03 DIAGNOSIS — I10 Essential (primary) hypertension: Secondary | ICD-10-CM

## 2021-09-03 MED ORDER — FLECAINIDE ACETATE 50 MG PO TABS: ORAL_TABLET | ORAL | 3 refills | Status: DC

## 2021-09-03 MED ORDER — METOPROLOL SUCCINATE ER 25 MG PO TB24: 25.0000 mg | ORAL_TABLET | Freq: Every day | ORAL | 3 refills | Status: DC

## 2021-09-03 NOTE — Patient Instructions (Addendum)
Medication Instructions:  Your physician recommends that you continue on your current medications as directed. Please refer to the Current Medication list given to you today. *If you need a refill on your cardiac medications before your next appointment, please call your pharmacy*  Lab Work: CBC, BMP If you have labs (blood work) drawn today and your tests are completely normal, you will receive your results only by: [place] (if you have MyChart) OR A paper copy in the mail If you have any lab test that is abnormal or we need to change your treatment, we will call you to review the results.  Testing/Procedures: None.  Follow-Up: At [facility], you and your health needs are our priority.  As part of our continuing mission to provide you with exceptional heart care, we have created designated Provider Care Teams.  These Care Teams include your primary Cardiologist (physician) and Advanced Practice Providers (APPs -  Physician Assistants and Nurse Practitioners) who all work together to provide you with the care you need, when you need it.  Your physician wants you to follow-up in: 12 months with Cristopher Peru, MD     You will receive a reminder letter in the mail two months in advance. If you don't receive a letter, please call our office to schedule the follow-up appointment.  We recommend signing up for the patient portal called "MyChart".  Sign up information is provided on this After Visit Summary.  MyChart is used to connect with patients for Virtual Visits (Telemedicine).  Patients are able to view lab/test results, encounter notes, upcoming appointments, etc.  Non-urgent messages can be sent to your provider as well.   To learn more about what you can do with MyChart, go to [email or web address].    Any Other Special Instructions Will Be Listed Below (If Applicable).

## 2021-09-03 NOTE — Progress Notes (Signed)
HPI Gwendolyn Alvarado returns today for follow-up. She is a 76 year old woman with paroxysmal atrial fibrillation who has been nicely controlled with flecainide and beta blocker therapy.  She is not on systemic anticoagulation for her atrial fibrillation with her h/o cavernous hemangioma. She denies chest pain or shortness of breath. She has had no symptomatic atrial fibrillation on medical therapy.  Allergies  Allergen Reactions   Carbamazepine Hives and Other (See Comments)    Blisters similar to Katherina Right Syndrome   Dilantin [Phenytoin Sodium Extended] Hives and Other (See Comments)    Blisters similar to Remo Lipps Johnston's Syndrome Blisters   Doxycycline Nausea And Vomiting and Other (See Comments)    Made her sick for about 6 weeks & lost 30 lbs Made her sick for about 6 weeks & lost 30 lbs   Phenytoin Sodium Extended Other (See Comments) and Hives    Other reaction(s): Hives blisters  Blisters similar to [employer] Syndrome   Vicodin [Hydrocodone-Acetaminophen] Other (See Comments) and Nausea And Vomiting   Phenytoin Sodium    Hydrocodone-Acetaminophen Nausea And Vomiting   Penicillins Itching and Rash    Other reaction(s): Itching     Current Outpatient Medications  Medication Sig Dispense Refill   Cholecalciferol (VITAMIN D3 PO) Take 1 tablet by mouth daily.     DEXILANT 60 MG capsule Take 1 capsule (60 mg total) by mouth daily. 90 capsule 1   diclofenac sodium (VOLTAREN) 1 % GEL Apply 2-4 g topically 3 (three) times daily as needed.     dicyclomine (BENTYL) 10 MG capsule Take 1-2 capsules every 4-6 hours as needed 60 capsule 3   flecainide (TAMBOCOR) 50 MG tablet TAKE 1 TABLET(50 MG) BY MOUTH TWICE DAILY. Please keep upcoming appt in January 2023 with Dr. Lovena Le before anymore refills. Thank you 180 tablet 0   Lacosamide 150 MG TABS Take 1 tablet by mouth 2 (two) times daily.     metoprolol succinate (TOPROL-XL) 25 MG 24 hr tablet Take 1 tablet (25 mg total) by  mouth daily. 90 tablet 0   Probiotic Product ([place]) Take by mouth.     No current facility-administered medications for this visit.     Past Medical History:  Diagnosis Date   Angioma cavernosum    Atrial fibrillation ([place])    Colon polyps    Diverticulitis    Diverticulosis    HTN (hypertension)    Hypotension 06/23/2016   Irritable bowel syndrome (IBS)    Seizure disorder ([place])     ROS:   All systems reviewed and negative except as noted in the HPI.   Past Surgical History:  Procedure Laterality Date   ABDOMINAL HYSTERECTOMY     Bartholins gland cyst resection     BREAST EXCISIONAL BIOPSY Right    benign   CERVICAL DISC SURGERY     C2   CHOLECYSTECTOMY     SALIVARY GLAND SURGERY       Family History  Problem Relation Age of Onset   Stroke Mother    Lung cancer Mother    Seizures Neg Hx      Social History   Socioeconomic History   Marital status: Married    Spouse name: Not on file   Number of children: 3   Years of education: Not on file   Highest education level: Not on file  Occupational History   Occupation: retired  Tobacco Use   Smoking status: Never   Smokeless tobacco: Never  Vaping Use   Vaping Use: Never used  Substance and Sexual Activity   Alcohol use: No   Drug use: No   Sexual activity: Not on file  Other Topics Concern   Not on file  Social History Narrative   Not on file   Social Determinants of Health   Financial Resource Strain: Not on file  Food Insecurity: Not on file  Transportation Needs: Not on file  Physical Activity: Not on file  Stress: Not on file  Social Connections: Not on file  Intimate Partner Violence: Not on file     BP 112/64    Pulse 60    Ht 5\' 5"  (1.651 m)    Wt 165 lb (74.8 kg)    SpO2 97%    BMI 27.46 kg/m   Physical Exam:  Well appearing NAD HEENT: Unremarkable Neck:  No JVD, no thyromegally Lymphatics:  No adenopathy Back:  No CVA tenderness Lungs:  Clear with no  wheezes HEART:  Regular rate rhythm, no murmurs, no rubs, no clicks Abd:  soft, positive bowel sounds, no organomegally, no rebound, no guarding Ext:  2 plus pulses, no edema, no cyanosis, no clubbing Skin:  No rashes no nodules Neuro:  CN II through XII intact, motor grossly intact  EKG - nsr  Assess/Plan:  PAF - she will continue flecainide.  Cavernous hemangioma - she is asymptomatic but will not undergo systemic anti-coagulation.   Carleene Overlie Abriel Hattery,MD

## 2021-09-20 ENCOUNTER — Telehealth: Payer: Self-pay | Admitting: Internal Medicine

## 2021-09-20 ENCOUNTER — Encounter: Payer: Self-pay | Admitting: Internal Medicine

## 2021-09-20 NOTE — Telephone Encounter (Signed)
See my chart message

## 2021-09-20 NOTE — Telephone Encounter (Signed)
Pt c/o BP issue: STAT if pt c/o blurred vision, one-sided weakness or slurred speech  1. What are your last 5 BP readings? 142/66, 141/58- she says this real high for her  2. Are you having any other symptoms (ex. Dizziness, headache, blurred vision, passed out)? Headache and extremely tired  3. What is your BP issue? High blood pressure- please see her note in MY Chart that she sent this morning(09-20-21)

## 2021-10-30 ENCOUNTER — Other Ambulatory Visit: Payer: Self-pay | Admitting: Family Medicine

## 2021-10-30 DIAGNOSIS — Z1231 Encounter for screening mammogram for malignant neoplasm of breast: Secondary | ICD-10-CM

## 2021-11-05 ENCOUNTER — Telehealth: Payer: Self-pay | Admitting: Internal Medicine

## 2021-11-05 MED ORDER — CIPROFLOXACIN HCL 500 MG PO TABS: 500.0000 mg | ORAL_TABLET | Freq: Two times a day (BID) | ORAL | 0 refills | Status: AC

## 2021-11-05 NOTE — Telephone Encounter (Signed)
Send her in Cipro 500 mg twice daily for 10 days. ?Low residue diet.  Advance as tolerated ?Thanks ?

## 2021-11-05 NOTE — Telephone Encounter (Signed)
Returned call to patient. Pt reports that she is having a diverticulitis flare. Reports abdominal soreness started yesterday, but she has overall felt bad for the past few days. Pt denies any fever. Pt is at the beach right now and wonders if something can be sent in for her. Her Cipro prescription expired in 07/14/21.  ?Pt requested that prescriptions be sent to [facility], [place], [place] [id number] - [phone]. Please advise, thanks.  ?

## 2021-11-05 NOTE — Telephone Encounter (Signed)
Prescription sent to requested pharmacy. I called and spoke with pt regarding Dr. Blanch Media recommendations. Pt verbalized understanding and had no concerns at the end of the call. ?

## 2021-11-05 NOTE — Telephone Encounter (Signed)
Patient called to say she was having a diverticulitis flare-up.  She has some Cipro, but it expired 05/2021.  She wanted to know if she should take that or if Dr. Henrene Pastor would call in another prescription for her.  She is out of town and would like you to call her on her call phone.  Thank you.  ?

## 2021-11-20 ENCOUNTER — Ambulatory Visit
Admission: RE | Admit: 2021-11-20 | Discharge: 2021-11-20 | Disposition: A | Discharge status: Home / Self Care | Payer: Medicare PPO | Source: Ambulatory Visit | Attending: Family Medicine | Admitting: Family Medicine

## 2021-11-20 DIAGNOSIS — Z1231 Encounter for screening mammogram for malignant neoplasm of breast: Secondary | ICD-10-CM

## 2021-12-05 ENCOUNTER — Telehealth: Payer: Self-pay | Admitting: Internal Medicine

## 2021-12-05 NOTE — Telephone Encounter (Signed)
Spoke with patient and told her she is due for an office visit so we went ahead and scheduled an office visit for before her trip.  Patient agreed.  ?

## 2021-12-05 NOTE — Telephone Encounter (Signed)
Patient called requesting a refill of Cipro.  She is going to [place] on a safari and just wants to have it available to her "just in case."  If there is an issue with this or if she needs an OV prior, please call patient and advise.  Thank you. ?

## 2022-01-24 ENCOUNTER — Encounter: Payer: Self-pay | Admitting: Internal Medicine

## 2022-01-24 ENCOUNTER — Ambulatory Visit: Payer: Medicare PPO | Admitting: Internal Medicine

## 2022-01-24 VITALS — BP 140/80 | HR 72 | Ht 63.25 in | Wt 164.1 lb

## 2022-01-24 DIAGNOSIS — K589 Irritable bowel syndrome without diarrhea: Secondary | ICD-10-CM

## 2022-01-24 DIAGNOSIS — K219 Gastro-esophageal reflux disease without esophagitis: Secondary | ICD-10-CM

## 2022-01-24 DIAGNOSIS — K5732 Diverticulitis of large intestine without perforation or abscess without bleeding: Secondary | ICD-10-CM | POA: Diagnosis not present

## 2022-01-24 MED ORDER — DEXILANT 60 MG PO CPDR: 60.0000 mg | DELAYED_RELEASE_CAPSULE | Freq: Every day | ORAL | 3 refills | Status: DC

## 2022-01-24 MED ORDER — DICYCLOMINE HCL 10 MG PO CAPS: ORAL_CAPSULE | ORAL | 6 refills | Status: DC

## 2022-01-24 MED ORDER — CIPROFLOXACIN HCL 500 MG PO TABS: 500.0000 mg | ORAL_TABLET | Freq: Two times a day (BID) | ORAL | 1 refills | Status: DC

## 2022-01-24 NOTE — Progress Notes (Signed)
gerd 

## 2022-01-24 NOTE — Patient Instructions (Signed)
If you are age 77 or older, your body mass index should be between 23-30. Your Body mass index is 28.84 kg/m. If this is out of the aforementioned range listed, please consider follow up with your Primary Care Provider.  If you are age 79 or younger, your body mass index should be between 19-25. Your Body mass index is 28.84 kg/m. If this is out of the aformentioned range listed, please consider follow up with your Primary Care Provider.   ________________________________________________________  The [place] [facility] providers would like to encourage you to use [facility] to communicate with providers for non-urgent requests or questions.  Due to long hold times on the telephone, sending your provider a message by [facility] may be a faster and more efficient way to get a response.  Please allow 48 business hours for a response.  Please remember that this is for non-urgent requests.  _______________________________________________________ We have sent the following medications to your pharmacy for you to pick up at your convenience:  Bentyl, [place], Cipro

## 2022-01-24 NOTE — Progress Notes (Signed)
HISTORY OF PRESENT ILLNESS:  Gwendolyn Alvarado is a very pleasant 76 y.o. female, wife of Ed and mother of [employer] physician, who presents today for routine GI follow-up regarding history of GERD, IBS, and diverticulitis.  She has a history of chronic GI problems for which she was initially evaluated upon self-referral August 2020.  Issues include diarrhea predominant irritable bowel syndrome and recurrent diverticulitis.  Previous colonoscopy in May 2018 was normal except for diverticulosis.  She was last seen in this February 2022.  See that dictation for details.  Subsequently underwent CT scan April 2022 for lower abdominal pain.  She was noted to have diverticulosis but no active diverticulitis.  Her irritable bowel has improved with fiber and antispasmodics.  These are continued.  She was told to contact the office for possible flare of diverticulitis.  She tells me that she had a flare about the 4 months ago the.  She was placed on ciprofloxacin 500 mg twice daily (she had the prescription on hand).  She presents today for follow-up.  She is pleased to report that her diverticulitis resolved within a few days without recurrence.  She reports good control of her reflux symptoms (except for dietary indiscretion) on Dexilant.  She occasionally uses antispasmodics.  She does describe problems with her irritable bowel after eating fried shrimp at the beach.  Currently doing well.  Going to [place] next week and would like to have a prescription of ciprofloxacin on hand should she have a diverticulitis attack.  REVIEW OF SYSTEMS:  All non-GI ROS negative unless otherwise stated in the HPI except for arthritis, cough  Past Medical History:  Diagnosis Date   Angioma cavernosum    Atrial fibrillation ([place])    Colon polyps    Diverticulitis    Diverticulosis    HTN (hypertension)    Hypotension 06/23/2016   Irritable bowel syndrome (IBS)    Seizure disorder ([facility])     Past Surgical History:  Procedure  Laterality Date   ABDOMINAL HYSTERECTOMY     Bartholins gland cyst resection     BREAST EXCISIONAL BIOPSY Right    benign   CERVICAL DISC SURGERY     C2   CHOLECYSTECTOMY     SALIVARY GLAND SURGERY      Social History Benita Boonstra  reports that she has never smoked. She has never used smokeless tobacco. She reports that she does not drink alcohol and does not use drugs.  family history includes Breast cancer in her paternal aunt; Lung cancer in her mother; Stroke in her mother.  Allergies  Allergen Reactions   Carbamazepine Hives and Other (See Comments)    Blisters similar to Katherina Right Syndrome   Dilantin [Phenytoin Sodium Extended] Hives and Other (See Comments)    Blisters similar to Remo Lipps Johnston's Syndrome Blisters   Doxycycline Nausea And Vomiting and Other (See Comments)    Made her sick for about 6 weeks & lost 30 lbs Made her sick for about 6 weeks & lost 30 lbs   Phenytoin Sodium Extended Other (See Comments) and Hives    Other reaction(s): Hives blisters  Blisters similar to [employer] Syndrome   Vicodin [Hydrocodone-Acetaminophen] Other (See Comments) and Nausea And Vomiting   Phenytoin Sodium    Hydrocodone-Acetaminophen Nausea And Vomiting   Penicillins Itching and Rash    Other reaction(s): Itching       PHYSICAL EXAMINATION: Vital signs: BP 140/80 (BP Location: Left Arm, Patient Position: Sitting, Cuff Size: Normal)  Pulse 72   Ht 5' 3.25" (1.607 m) Comment: height measured without shoes  Wt 164 lb 2 oz (74.4 kg)   BMI 28.84 kg/m   Constitutional: generally well-appearing, no acute distress Psychiatric: alert and oriented x3, cooperative Eyes: extraocular movements intact, anicteric, conjunctiva pink Mouth: oral pharynx moist, no lesions Neck: supple no lymphadenopathy Cardiovascular: heart regular rate and rhythm, no murmur Lungs: clear to auscultation bilaterally Abdomen: soft, nontender, nondistended, no obvious ascites, no  peritoneal signs, normal bowel sounds, no organomegaly Rectal: Omitted Extremities: no clubbing, cyanosis, or lower extremity edema bilaterally Skin: no lesions on visible extremities Neuro: No focal deficits.   ASSESSMENT:  1.  GERD.  Symptoms controlled with PPI 3.  IBS.  Uses dicyclomine on demand.  On fiber. 3.  History of diverticulosis complicated by diverticulitis 4.  Colonoscopy 2018 in [place] was negative for neoplasia   PLAN:  1.  Reflux precautions 2.  Refill Dexilant. 3.  Continue fiber supplementation 4.  Refill Bentyl 5.  Prescription for ciprofloxacin 500 mg twice daily for 10 days.  1 refill.  To be used if necessary for diverticular flare.  Asked to contact the office should she initiate therapy for documentation purposes and additional recommendations if needed. 6.  Routine GI office follow-up 1 year.  Interval follow-up as needed

## 2022-03-04 ENCOUNTER — Ambulatory Visit
Admission: RE | Admit: 2022-03-04 | Discharge: 2022-03-04 | Disposition: A | Discharge status: Home / Self Care | Payer: Medicare PPO | Source: Ambulatory Visit | Attending: Family Medicine | Admitting: Family Medicine

## 2022-03-04 ENCOUNTER — Other Ambulatory Visit: Payer: Self-pay | Admitting: Family Medicine

## 2022-03-04 DIAGNOSIS — R059 Cough, unspecified: Secondary | ICD-10-CM

## 2022-03-26 ENCOUNTER — Encounter (HOSPITAL_BASED_OUTPATIENT_CLINIC_OR_DEPARTMENT_OTHER): Payer: Self-pay | Admitting: Pharmacy Technician

## 2022-03-26 ENCOUNTER — Telehealth: Payer: Self-pay | Admitting: Internal Medicine

## 2022-03-26 ENCOUNTER — Emergency Department (HOSPITAL_BASED_OUTPATIENT_CLINIC_OR_DEPARTMENT_OTHER)
Admission: EM | Admit: 2022-03-26 | Discharge: 2022-03-26 | Disposition: A | Discharge status: Home / Self Care | Payer: Medicare PPO | Attending: Emergency Medicine | Admitting: Emergency Medicine

## 2022-03-26 ENCOUNTER — Other Ambulatory Visit: Payer: Self-pay

## 2022-03-26 ENCOUNTER — Emergency Department (HOSPITAL_BASED_OUTPATIENT_CLINIC_OR_DEPARTMENT_OTHER): Payer: Medicare PPO

## 2022-03-26 DIAGNOSIS — Z79899 Other long term (current) drug therapy: Secondary | ICD-10-CM | POA: Insufficient documentation

## 2022-03-26 DIAGNOSIS — R1032 Left lower quadrant pain: Secondary | ICD-10-CM | POA: Insufficient documentation

## 2022-03-26 DIAGNOSIS — I10 Essential (primary) hypertension: Secondary | ICD-10-CM | POA: Insufficient documentation

## 2022-03-26 DIAGNOSIS — R1030 Lower abdominal pain, unspecified: Secondary | ICD-10-CM | POA: Diagnosis present

## 2022-03-26 DIAGNOSIS — R197 Diarrhea, unspecified: Secondary | ICD-10-CM | POA: Insufficient documentation

## 2022-03-26 LAB — COMPREHENSIVE METABOLIC PANEL
ALT: 29 U/L (ref 0–44)
AST: 28 U/L (ref 15–41)
Albumin: 4.7 g/dL (ref 3.5–5.0)
Alkaline Phosphatase: 85 U/L (ref 38–126)
Anion gap: 9 (ref 5–15)
BUN: 11 mg/dL (ref 8–23)
CO2: 27 mmol/L (ref 22–32)
Calcium: 9.4 mg/dL (ref 8.9–10.3)
Chloride: 101 mmol/L (ref 98–111)
Creatinine, Ser: 0.78 mg/dL (ref 0.44–1.00)
GFR, Estimated: >60 mL/min (ref >60)
Glucose, Bld: 112 mg/dL — ABNORMAL HIGH (ref 70–99)
Potassium: 3.8 mmol/L (ref 3.5–5.1)
Sodium: 137 mmol/L (ref 135–145)
Total Bilirubin: 1.6 mg/dL — ABNORMAL HIGH (ref 0.3–1.2)
Total Protein: 7.7 g/dL (ref 6.5–8.1)

## 2022-03-26 LAB — URINALYSIS, ROUTINE W REFLEX MICROSCOPIC
Bilirubin Urine: NEGATIVE
Glucose, UA: NEGATIVE mg/dL
Hgb urine dipstick: NEGATIVE
Ketones, ur: NEGATIVE mg/dL
Leukocytes,Ua: NEGATIVE
Nitrite: NEGATIVE
Protein, ur: NEGATIVE mg/dL
Specific Gravity, Urine: 1.044 — ABNORMAL HIGH (ref 1.005–1.030)
pH: 6 (ref 5.0–8.0)

## 2022-03-26 LAB — CBC
HCT: 45.1 % (ref 36.0–46.0)
Hemoglobin: 15.4 g/dL — ABNORMAL HIGH (ref 12.0–15.0)
MCH: 30.9 pg (ref 26.0–34.0)
MCHC: 34.1 g/dL (ref 30.0–36.0)
MCV: 90.6 fL (ref 80.0–100.0)
Platelets: 273 10*3/uL (ref 150–400)
RBC: 4.98 MIL/uL (ref 3.87–5.11)
RDW: 12.9 % (ref 11.5–15.5)
WBC: 7.5 10*3/uL (ref 4.0–10.5)
nRBC: 0 % (ref 0.0–0.2)

## 2022-03-26 LAB — LIPASE, BLOOD: Lipase: 24 U/L (ref 11–51)

## 2022-03-26 MED ORDER — IOHEXOL 300 MG/ML  SOLN
100.0000 mL | Freq: Once | INTRAMUSCULAR | Status: AC | PRN
  Administered 2022-03-26: 80 mL via INTRAVENOUS

## 2022-03-26 NOTE — Telephone Encounter (Signed)
Spoke with pt and she is aware of Dr. Dayle Points recommendations and will go to the ER to be evaluated.

## 2022-03-26 NOTE — Telephone Encounter (Signed)
Dr. Candis Schatz Please see note below and advise as DOD.

## 2022-03-26 NOTE — Telephone Encounter (Signed)
Gwendolyn Alvarado pt with h/o diverticulitis. In 2017 she reports she had diverticulitis and was septic. She states she has had the hurting at the bottom of her stomach that she ususally has with a diverticulitis flare, no energy, feels weak, no fever. Since Friday she has only eaten jello, a baked potato, water, yogurt, and a piece of white bread. Dr. [name] gives her cipro to have on hand in case she has a flare. She started taking the cipro Saturday night but states the bottom of her stomach is still hurting and she feels bad. States her bowels have not moved since Friday but she is not eating much. Dr. Loletha Carrow as DOD please advise.

## 2022-03-26 NOTE — ED Notes (Signed)
Patient ambulates to restroom, steady gait, no assistance needed. No noted distress.

## 2022-03-26 NOTE — ED Notes (Signed)
Reviewed AVS/discharge instruction with patient. Time allotted for and all questions answered. Patient is agreeable for d/c and escorted to ed exit by staff.  

## 2022-03-26 NOTE — ED Provider Notes (Signed)
[facility] EMERGENCY DEPT Provider Note   CSN: [id number] Arrival date & time: 03/26/22  1322     History  Chief Complaint  Patient presents with   Abdominal Pain    Gwendolyn Alvarado is a 76 y.o. female.  Patient with history of diverticulitis, hypertension, paroxysmal atrial fibrillation, cholecystectomy --presents to the emergency department for evaluation of lower abdominal pain and diarrhea.  Her symptoms started about 5 days ago.  She had diarrhea at onset without bleeding.  This has improved.  She started taking Cipro, that she has at home as needed for diverticulitis symptoms, because she has had several times.  This is prescribed by her gastroenterologist.  She states that her symptoms have generally improved however she continues to have some lower abdominal pain and felt generally unwell this morning.  She reports having sepsis in the past related to diverticulitis and was concerned that she may be developing this.  No urinary symptoms.  No fevers.       Home Medications Prior to Admission medications   Medication Sig Start Date End Date Taking? Authorizing Provider  acetaminophen (TYLENOL) 325 MG tablet Take 650 mg by mouth every 6 (six) hours as needed.   Yes [provider]  ciprofloxacin (CIPRO) 500 MG tablet Take 1 tablet (500 mg total) by mouth 2 (two) times daily. 01/24/22  Yes Irene Shipper, MD  DEXILANT 60 MG capsule Take 1 capsule (60 mg total) by mouth daily. 01/24/22  Yes Irene Shipper, MD  flecainide (TAMBOCOR) 50 MG tablet TAKE 1 TABLET(50 MG) BY MOUTH TWICE DAILY. 09/03/21  Yes Evans Lance, MD  Lacosamide 150 MG TABS Take 1 tablet by mouth 2 (two) times daily.   Yes [provider]  metoprolol succinate (TOPROL-XL) 25 MG 24 hr tablet Take 1 tablet (25 mg total) by mouth daily. 09/03/21  Yes Evans Lance, MD  Probiotic Product ([place]) Take by mouth.   Yes [provider]  diclofenac sodium  (VOLTAREN) 1 % GEL Apply 2-4 g topically 3 (three) times daily as needed. 11/03/18   [provider]  dicyclomine (BENTYL) 10 MG capsule Take 1-2 capsules every 4-6 hours as needed 01/24/22   Irene Shipper, MD      Allergies    Carbamazepine, Dilantin [phenytoin sodium extended], Doxycycline, Phenytoin sodium extended, Vicodin [hydrocodone-acetaminophen], Phenytoin sodium, Hydrocodone-acetaminophen, and Penicillins    Review of Systems   Review of Systems  Physical Exam Updated Vital Signs BP (!) 117/103   Pulse 73   Temp 98.1 F (36.7 C)   Resp 16   SpO2 97%   Physical Exam Vitals and nursing note reviewed.  Constitutional:      General: She is not in acute distress.    Appearance: She is well-developed.  HENT:     Head: Normocephalic and atraumatic.     Right Ear: External ear normal.     Left Ear: External ear normal.     Nose: Nose normal.  Eyes:     Conjunctiva/sclera: Conjunctivae normal.  Cardiovascular:     Rate and Rhythm: Normal rate and regular rhythm.     Heart sounds: No murmur heard. Pulmonary:     Effort: No respiratory distress.     Breath sounds: No wheezing, rhonchi or rales.  Abdominal:     Palpations: Abdomen is soft.     Tenderness: There is abdominal tenderness (Mild) in the suprapubic area and left lower quadrant. There is no guarding or  rebound. Negative signs include Murphy's sign and McBurney's sign.  Musculoskeletal:     Cervical back: Normal range of motion and neck supple.     Right lower leg: No edema.     Left lower leg: No edema.  Skin:    General: Skin is warm and dry.     Findings: No rash.  Neurological:     General: No focal deficit present.     Mental Status: She is alert. Mental status is at baseline.     Motor: No weakness.  Psychiatric:        Mood and Affect: Mood normal.     ED Results / Procedures / Treatments   Labs (all labs ordered are listed, but only abnormal results are displayed) Labs Reviewed   COMPREHENSIVE METABOLIC PANEL - Abnormal; Notable for the following components:      Result Value   Glucose, Bld 112 (*)    Total Bilirubin 1.6 (*)    All other components within normal limits  CBC - Abnormal; Notable for the following components:   Hemoglobin 15.4 (*)    All other components within normal limits  LIPASE, BLOOD  URINALYSIS, ROUTINE W REFLEX MICROSCOPIC    EKG None  Radiology CT ABDOMEN PELVIS W CONTRAST  Result Date: 03/26/2022 CLINICAL DATA:  Left lower quadrant pain, evaluate for diverticulitis EXAM: CT ABDOMEN AND PELVIS WITH CONTRAST TECHNIQUE: Multidetector CT imaging of the abdomen and pelvis was performed using the standard protocol following bolus administration of intravenous contrast. RADIATION DOSE REDUCTION: This exam was performed according to the departmental dose-optimization program which includes automated exposure control, adjustment of the mA and/or kV according to patient size and/or use of iterative reconstruction technique. CONTRAST:  35m OMNIPAQUE IOHEXOL 300 MG/ML  SOLN COMPARISON:  CT abdomen/pelvis 11/08/2020 FINDINGS: Lower chest: The lung bases are clear. The imaged heart is unremarkable. Hepatobiliary: The liver is unremarkable. The gallbladder is surgically absent. There is no biliary ductal dilatation. Pancreas: Unremarkable. Spleen: Unremarkable. Adrenals/Urinary Tract: The adrenals are unremarkable. Homogeneously hypodense lesions in the right kidney are consistent with benign cysts for which no specific imaging follow-up is required, unchanged. 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 No specific imaging follow-up is required for this lesion. There are small nonobstructing left renal stones measuring up to 5 mm in the lower pole. There is no hydronephrosis or hydroureter. There is  symmetric excretion of contrast into the collecting system on the delayed images. The bladder is decompressed but grossly unremarkable. Stomach/Bowel: The stomach is unremarkable. There is no evidence of bowel obstruction. There is extensive diverticulosis without evidence of acute diverticulitis. Vascular/Lymphatic: There is mild calcified atherosclerotic plaque in the nonaneurysmal abdominal aorta. The major branch vessels are patent. The main portal and splenic veins are patent. There is no abdominal or pelvic lymphadenopathy. Reproductive: The uterus is surgically absent. There is no adnexal mass. Other: There is no ascites or free air. Musculoskeletal: There is no acute osseous abnormality or suspicious osseous lesion. IMPRESSION: 1. No acute finding in the abdomen or pelvis. 2. Extensive diverticulosis without evidence of acute diverticulitis. 3. 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 4. Nonobstructing left renal stones measuring up to 5 mm. Electronically Signed   By: PValetta MoleM.D.   On: 03/26/2022 15:28    Procedures Procedures    Medications Ordered in ED  Medications  iohexol (OMNIPAQUE) 300 MG/ML solution 100 mL (80 mLs Intravenous Contrast Given 03/26/22 1509)    ED Course/ Medical Decision Making/ A&P    Patient seen and examined. History obtained directly from patient.   Labs/EKG: Ordered CBC, CMP, lipase.  Imaging: Ordered CT abdomen pelvis.  Medications/Fluids: None ordered  Most recent vital signs reviewed and are as follows: BP (!) 117/103   Pulse 73   Temp 98.1 F (36.7 C)   Resp 16   SpO2 97%   Initial impression: Lower abdominal pain, likely acute diverticulitis  4:21 PM Reassessment performed. Patient appears very comfortable.  Reassured by her results today.  Labs personally reviewed and interpreted including: White blood cell count 7.5 normal, hemoglobin minimally elevated at 15.4; CMP unremarkable with normal  kidney function; lipase normal.  Imaging personally visualized and interpreted including: CT abdomen pelvis, agree known renal cyst, no findings or complications of acute diverticulitis.  Reviewed pertinent lab work and imaging with patient at bedside. Questions answered.   Most current vital signs reviewed and are as follows: BP (!) 117/103   Pulse 73   Temp 98.1 F (36.7 C)   Resp 16   SpO2 97%   Plan: Discharge to home.   Prescriptions written for: None  Other home care instructions discussed: No specific dietary restriction, continue home Cipro for total of 7 days.  ED return instructions discussed: The patient was urged to return to the Emergency Department immediately with worsening of current symptoms, worsening abdominal pain, persistent vomiting, blood noted in stools, fever, or any other concerns. The patient verbalized understanding.   Follow-up instructions discussed: Patient encouraged to follow-up with their PCP as needed.                            Medical Decision Making Amount and/or Complexity of Data Reviewed Labs: ordered. Radiology: ordered.  Risk Prescription drug management.   For this patient's complaint of abdominal pain, the following conditions were considered on the differential diagnosis: gastritis/PUD, enteritis/duodenitis, appendicitis, cholelithiasis/cholecystitis, cholangitis, pancreatitis, ruptured viscus, colitis, diverticulitis, small/large bowel obstruction, proctitis, cystitis, pyelonephritis, ureteral colic, aortic dissection, aortic aneurysm. In women, pelvic inflammatory disease, ovarian cysts, and tubo-ovarian abscess were also considered. Atypical chest etiologies were also considered including ACS, PE, and pneumonia.   The patient's vital signs, pertinent lab work and imaging were reviewed and interpreted as discussed in the ED course. Hospitalization was considered for further testing, treatments, or serial exams/observation. However  as patient is well-appearing, has a stable exam, and reassuring studies today, I do not feel that they warrant admission at this time. This plan was discussed with the patient who verbalizes agreement and comfort with this plan and seems reliable and able to return to the Emergency Department with worsening or changing symptoms.          Final Clinical Impression(s) / ED Diagnoses Final diagnoses:  Lower abdominal pain    Rx / DC Orders ED Discharge Orders     None         Carlisle Cater, Hershal Coria 03/26/22 1624    Dorie Rank, MD 03/27/22 1744

## 2022-03-26 NOTE — Discharge Instructions (Signed)
Please read and follow all provided instructions.  Your diagnoses today include:  1. Lower abdominal pain     Tests performed today include: Blood cell counts and platelets: Blood counts are normal Kidney and liver function tests Pancreas function test (called lipase) Urine test to look for infection CT scan of your abdomen: No acute findings and no active diverticulitis Vital signs. See below for your results today.   Medications prescribed:  None  Take any prescribed medications only as directed.  Home care instructions:  Follow any educational materials contained in this packet. Continue Cipro for total of 7 days  Follow-up instructions: Please follow-up with your primary care provider as needed for further evaluation of your symptoms, especially if not resolving.  Return instructions:  SEEK IMMEDIATE MEDICAL ATTENTION IF: The pain does not go away or becomes severe  A temperature above 101F develops  Repeated vomiting occurs (multiple episodes)  The pain becomes localized to portions of the abdomen. The right side could possibly be appendicitis. In an adult, the left lower portion of the abdomen could be colitis or diverticulitis.  Blood is being passed in stools or vomit (bright red or black tarry stools)  You develop chest pain, difficulty breathing, dizziness or fainting, or become confused, poorly responsive, or inconsolable (young children) If you have any other emergent concerns regarding your health  Additional Information: Abdominal (belly) pain can be caused by many things. Your caregiver performed an examination and possibly ordered blood/urine tests and imaging (CT scan, x-rays, ultrasound). Many cases can be observed and treated at home after initial evaluation in the emergency department. Even though you are being discharged home, abdominal pain can be unpredictable. Therefore, you need a repeated exam if your pain does not resolve, returns, or worsens. Most  patients with abdominal pain don't have to be admitted to the hospital or have surgery, but serious problems like appendicitis and gallbladder attacks can start out as nonspecific pain. Many abdominal conditions cannot be diagnosed in one visit, so follow-up evaluations are very important.  Your vital signs today were: BP 120/71   Pulse 73   Temp 98.1 F (36.7 C)   Resp 16   SpO2 96%  If your blood pressure (bp) was elevated above 135/85 this visit, please have this repeated by your doctor within one month. --------------

## 2022-03-26 NOTE — ED Triage Notes (Addendum)
Pt here with fatigue, lower abdominal discomfort and diarrhea onset Thursday. Pt with hx diverticulitis, started taking Cipro Saturday night.

## 2022-03-26 NOTE — Telephone Encounter (Signed)
Patient called stating she feels like she is having a diverticulitis flare.  She has been taking Cipro for 6 days and it is not helping her.  She does not want to get sepsis from it again.  She wants to know what she can do.  Please call patient and advise.  Thank you.

## 2022-03-27 NOTE — Progress Notes (Deleted)
PCP:  Antony Contras, MD Primary Cardiologist: None Electrophysiologist: Cristopher Peru, MD   Gwendolyn Alvarado is a 76 y.o. female seen today for Cristopher Peru, MD for routine electrophysiology followup.  Since last being seen in our clinic the patient reports doing ***.  she denies chest pain, palpitations, dyspnea, PND, orthopnea, nausea, vomiting, dizziness, syncope, edema, weight gain, or early satiety.  Past Medical History:  Diagnosis Date   Angioma cavernosum    Atrial fibrillation (HCC)    Colon polyps    Diverticulitis    Diverticulosis    HTN (hypertension)    Hypotension 06/23/2016   Irritable bowel syndrome (IBS)    Seizure disorder (HCC)    Past Surgical History:  Procedure Laterality Date   ABDOMINAL HYSTERECTOMY     Bartholins gland cyst resection     BREAST EXCISIONAL BIOPSY Right    benign   CERVICAL DISC SURGERY     C2   CHOLECYSTECTOMY     SALIVARY GLAND SURGERY      Current Outpatient Medications  Medication Sig Dispense Refill   acetaminophen (TYLENOL) 325 MG tablet Take 650 mg by mouth every 6 (six) hours as needed.     ciprofloxacin (CIPRO) 500 MG tablet Take 1 tablet (500 mg total) by mouth 2 (two) times daily. 20 tablet 1   DEXILANT 60 MG capsule Take 1 capsule (60 mg total) by mouth daily. 90 capsule 3   diclofenac sodium (VOLTAREN) 1 % GEL Apply 2-4 g topically 3 (three) times daily as needed.     dicyclomine (BENTYL) 10 MG capsule Take 1-2 capsules every 4-6 hours as needed 60 capsule 6   flecainide (TAMBOCOR) 50 MG tablet TAKE 1 TABLET(50 MG) BY MOUTH TWICE DAILY. 180 tablet 3   Lacosamide 150 MG TABS Take 1 tablet by mouth 2 (two) times daily.     metoprolol succinate (TOPROL-XL) 25 MG 24 hr tablet Take 1 tablet (25 mg total) by mouth daily. 90 tablet 3   Probiotic Product ([place]) Take by mouth.     No current facility-administered medications for this visit.    Allergies  Allergen Reactions   Carbamazepine Hives and  Other (See Comments)    Blisters similar to Katherina Right Syndrome   Dilantin [Phenytoin Sodium Extended] Hives and Other (See Comments)    Blisters similar to Remo Lipps Johnston's Syndrome Blisters   Doxycycline Nausea And Vomiting and Other (See Comments)    Made her sick for about 6 weeks & lost 30 lbs Made her sick for about 6 weeks & lost 30 lbs   Phenytoin Sodium Extended Other (See Comments) and Hives    Other reaction(s): Hives blisters  Blisters similar to [employer] Syndrome   Vicodin [Hydrocodone-Acetaminophen] Other (See Comments) and Nausea And Vomiting   Phenytoin Sodium    Hydrocodone-Acetaminophen Nausea And Vomiting   Penicillins Itching and Rash    Other reaction(s): Itching    Social History   Socioeconomic History   Marital status: Married    Spouse name: Not on file   Number of children: 3   Years of education: Not on file   Highest education level: Not on file  Occupational History   Occupation: retired  Tobacco Use   Smoking status: Never   Smokeless tobacco: Never  Vaping Use   Vaping Use: Never used  Substance and Sexual Activity   Alcohol use: No   Drug use: No   Sexual activity: Not on file  Other Topics Concern  Not on file  Social History Narrative   Not on file   Social Determinants of Health   Financial Resource Strain: Not on file  Food Insecurity: Not on file  Transportation Needs: Not on file  Physical Activity: Not on file  Stress: Not on file  Social Connections: Not on file  Intimate Partner Violence: Not on file     Review of Systems: All other systems reviewed and are otherwise negative except as noted above.  Physical Exam: There were no vitals filed for this visit.  GEN- The patient is well appearing, alert and oriented x 3 today.   HEENT: normocephalic, atraumatic; sclera clear, conjunctiva pink; hearing intact; oropharynx clear; neck supple, no JVP Lymph- no cervical lymphadenopathy Lungs- Clear to  ausculation bilaterally, normal work of breathing.  No wheezes, rales, rhonchi Heart- Regular rate and rhythm, no murmurs, rubs or gallops, PMI not laterally displaced GI- soft, non-tender, non-distended, bowel sounds present, no hepatosplenomegaly Extremities- no clubbing, cyanosis, or edema; DP/PT/radial pulses 2+ bilaterally MS- no significant deformity or atrophy Skin- warm and dry, no rash or lesion Psych- euthymic mood, full affect Neuro- strength and sensation are intact  EKG is ordered. Personal review of EKG from today shows ***  Additional studies reviewed include: Previous EP office notes. ***  Assessment and Plan:  1. 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 Allred","Dr. Arlan Organ. Klein","Dr. Camnitz","Dr. Lambert","EP APP"} in {Blank single:19197::"2 weeks","4 weeks","3 months","6 months","12 months","as usual post gen change"}   Shirley Friar, PA-C  03/27/22 8:41 AM

## 2022-04-03 ENCOUNTER — Ambulatory Visit: Payer: Medicare PPO | Admitting: Student

## 2022-04-03 DIAGNOSIS — I480 Paroxysmal atrial fibrillation: Secondary | ICD-10-CM

## 2022-04-03 DIAGNOSIS — I10 Essential (primary) hypertension: Secondary | ICD-10-CM

## 2022-06-04 ENCOUNTER — Ambulatory Visit: Payer: Medicare PPO | Admitting: Internal Medicine

## 2022-06-14 ENCOUNTER — Emergency Department (HOSPITAL_BASED_OUTPATIENT_CLINIC_OR_DEPARTMENT_OTHER)
Admission: EM | Admit: 2022-06-14 | Discharge: 2022-06-14 | Disposition: A | Discharge status: Home / Self Care | Payer: Medicare PPO | Attending: Emergency Medicine | Admitting: Emergency Medicine

## 2022-06-14 ENCOUNTER — Emergency Department (HOSPITAL_BASED_OUTPATIENT_CLINIC_OR_DEPARTMENT_OTHER): Payer: Medicare PPO | Admitting: Radiology

## 2022-06-14 ENCOUNTER — Other Ambulatory Visit: Payer: Self-pay

## 2022-06-14 ENCOUNTER — Encounter (HOSPITAL_BASED_OUTPATIENT_CLINIC_OR_DEPARTMENT_OTHER): Payer: Self-pay | Admitting: Emergency Medicine

## 2022-06-14 ENCOUNTER — Emergency Department (HOSPITAL_BASED_OUTPATIENT_CLINIC_OR_DEPARTMENT_OTHER): Payer: Medicare PPO

## 2022-06-14 DIAGNOSIS — Z79899 Other long term (current) drug therapy: Secondary | ICD-10-CM | POA: Insufficient documentation

## 2022-06-14 DIAGNOSIS — D1802 Hemangioma of intracranial structures: Secondary | ICD-10-CM | POA: Diagnosis not present

## 2022-06-14 DIAGNOSIS — I10 Essential (primary) hypertension: Secondary | ICD-10-CM | POA: Diagnosis not present

## 2022-06-14 DIAGNOSIS — W133XXA Fall through floor, initial encounter: Secondary | ICD-10-CM | POA: Diagnosis not present

## 2022-06-14 DIAGNOSIS — S32020A Wedge compression fracture of second lumbar vertebra, initial encounter for closed fracture: Secondary | ICD-10-CM

## 2022-06-14 DIAGNOSIS — Y92009 Unspecified place in unspecified non-institutional (private) residence as the place of occurrence of the external cause: Secondary | ICD-10-CM | POA: Diagnosis not present

## 2022-06-14 DIAGNOSIS — I4891 Unspecified atrial fibrillation: Secondary | ICD-10-CM | POA: Insufficient documentation

## 2022-06-14 DIAGNOSIS — W19XXXA Unspecified fall, initial encounter: Secondary | ICD-10-CM

## 2022-06-14 DIAGNOSIS — S3992XA Unspecified injury of lower back, initial encounter: Secondary | ICD-10-CM | POA: Diagnosis present

## 2022-06-14 MED ORDER — ACETAMINOPHEN 325 MG PO TABS
650.0000 mg | ORAL_TABLET | Freq: Once | ORAL | Status: AC
  Administered 2022-06-14: 650 mg via ORAL
  Filled 2022-06-14: qty 2

## 2022-06-14 NOTE — ED Triage Notes (Signed)
Slipped and fell at home today. C/o L shoulder and low back pain. Did not hit her head.

## 2022-06-14 NOTE — Discharge Instructions (Addendum)
It was a pleasure caring for you today in the emergency department. ° °Please return to the emergency department for any worsening or worrisome symptoms. ° ° °

## 2022-06-14 NOTE — ED Provider Notes (Signed)
[facility] EMERGENCY DEPT Provider Note   CSN: [id number] Arrival date & time: 06/14/22  1418     History  Chief Complaint  Patient presents with   Gwendolyn Alvarado is a 76 y.o. female.  Patient as above with significant medical history as below, including A-fib (not on anticoag), hypertension, IBS who presents to the ED with complaint of fall.  Patient reports she was wearing socks, slipped on wood floor at home.  Fell onto her backside.  Hit left shoulder. Unsure if she hit her head, no LOC. No thinners. She was assisted back onto her feet by spouse.  No numbness or tingling.  No chest pain or dyspnea.  No neck pain. No recent medication or diet changes.  Pain to her low back.   No numbness or tingling, no urinary/bowel incontinence or overflow.  No saddle paresthesias.     Past Medical History:  Diagnosis Date   Angioma cavernosum    Atrial fibrillation (HCC)    Colon polyps    Diverticulitis    Diverticulosis    HTN (hypertension)    Hypotension 06/23/2016   Irritable bowel syndrome (IBS)    Seizure disorder (HCC)     Past Surgical History:  Procedure Laterality Date   ABDOMINAL HYSTERECTOMY     Bartholins gland cyst resection     BREAST EXCISIONAL BIOPSY Right    benign   CERVICAL DISC SURGERY     C2   CHOLECYSTECTOMY     SALIVARY GLAND SURGERY       The history is provided by the patient and the spouse.  Fall Pertinent negatives include no chest pain, no abdominal pain, no headaches and no shortness of breath.       Home Medications Prior to Admission medications   Medication Sig Start Date End Date Taking? Authorizing Provider  acetaminophen (TYLENOL) 325 MG tablet Take 650 mg by mouth every 6 (six) hours as needed.    [provider]  ciprofloxacin (CIPRO) 500 MG tablet Take 1 tablet (500 mg total) by mouth 2 (two) times daily. 01/24/22   Irene Shipper, MD  DEXILANT 60 MG capsule Take 1 capsule (60 mg total) by mouth  daily. 01/24/22   Irene Shipper, MD  diclofenac sodium (VOLTAREN) 1 % GEL Apply 2-4 g topically 3 (three) times daily as needed. 11/03/18   [provider]  dicyclomine (BENTYL) 10 MG capsule Take 1-2 capsules every 4-6 hours as needed 01/24/22   Irene Shipper, MD  flecainide (TAMBOCOR) 50 MG tablet TAKE 1 TABLET(50 MG) BY MOUTH TWICE DAILY. 09/03/21   Evans Lance, MD  Lacosamide 150 MG TABS Take 1 tablet by mouth 2 (two) times daily.    [provider]  metoprolol succinate (TOPROL-XL) 25 MG 24 hr tablet Take 1 tablet (25 mg total) by mouth daily. 09/03/21   Evans Lance, MD  Probiotic Product ([place]) Take by mouth.    [provider]      Allergies    Carbamazepine, Dilantin [phenytoin sodium extended], Doxycycline, Phenytoin sodium extended, Vicodin [hydrocodone-acetaminophen], Phenytoin sodium, Hydrocodone-acetaminophen, and Penicillins    Review of Systems   Review of Systems  Constitutional:  Negative for activity change and fever.  HENT:  Negative for facial swelling and trouble swallowing.   Eyes:  Negative for discharge and redness.  Respiratory:  Negative for cough and shortness of breath.   Cardiovascular:  Negative for chest pain and palpitations.  Gastrointestinal:  Negative for abdominal pain and nausea.  Genitourinary:  Negative for dysuria and flank pain.  Musculoskeletal:  Positive for arthralgias and back pain. Negative for gait problem.  Skin:  Negative for pallor and rash.  Neurological:  Negative for syncope and headaches.    Physical Exam Updated Vital Signs BP 133/66   Pulse (!) 59   Temp (!) 97.4 F (36.3 C) (Oral)   Resp 18   Ht '5\' 6"'$  (1.676 m)   Wt 70.3 kg   SpO2 97%   BMI 25.02 kg/m  Physical Exam Vitals and nursing note reviewed.  Constitutional:      General: She is not in acute distress.    Appearance: Normal appearance. She is not ill-appearing, toxic-appearing or diaphoretic.  HENT:     Head:  Normocephalic and atraumatic. No raccoon eyes, Battle's sign, right periorbital erythema or left periorbital erythema.     Right Ear: External ear normal.     Left Ear: External ear normal.     Nose: Nose normal.     Mouth/Throat:     Mouth: Mucous membranes are moist.  Eyes:     General: No scleral icterus.       Right eye: No discharge.        Left eye: No discharge.     Extraocular Movements: Extraocular movements intact.     Pupils: Pupils are equal, round, and reactive to light.  Neck:     Trachea: Phonation normal.  Cardiovascular:     Rate and Rhythm: Normal rate and regular rhythm.     Pulses: Normal pulses.     Heart sounds: Normal heart sounds.  Pulmonary:     Effort: Pulmonary effort is normal. No tachypnea, accessory muscle usage or respiratory distress.     Breath sounds: Normal breath sounds. No stridor.  Abdominal:     General: Abdomen is flat. There is no distension.     Tenderness: There is no abdominal tenderness.  Musculoskeletal:        General: Normal range of motion.     Cervical back: Full passive range of motion without pain and normal range of motion.     Right lower leg: No edema.     Left lower leg: No edema.     Comments: No midline spinous process tenderness to palpation or percussion, no crepitus or step-off.    No sig pain with log roll BLLE  Pelvis stable to ap pressure   Skin:    General: Skin is warm and dry.     Capillary Refill: Capillary refill takes less than 2 seconds.  Neurological:     Mental Status: She is alert and oriented to person, place, and time. Mental status is at baseline.     GCS: GCS eye subscore is 4. GCS verbal subscore is 5. GCS motor subscore is 6.     Sensory: Sensation is intact.     Motor: Motor function is intact.     Gait: Gait is intact.  Psychiatric:        Mood and Affect: Mood normal.        Behavior: Behavior normal.     ED Results / Procedures / Treatments   Labs (all labs ordered are listed, but  only abnormal results are displayed) Labs Reviewed - No data to display  EKG None  Radiology CT Lumbar Spine Wo Contrast  Result Date: 06/14/2022 CLINICAL DATA:  Slip and fall at home, low back pain EXAM: CT LUMBAR SPINE  WITHOUT CONTRAST TECHNIQUE: Multidetector CT imaging of the lumbar spine was performed without intravenous contrast administration. Multiplanar CT image reconstructions were also generated. RADIATION DOSE REDUCTION: This exam was performed according to the departmental dose-optimization program which includes automated exposure control, adjustment of the mA and/or kV according to patient size and/or use of iterative reconstruction technique. COMPARISON:  Prior dedicated CT lumbar spine, correlation is made with CT abdomen pelvis 03/26/2022 FINDINGS: Segmentation: 5 lumbar-type vertebral bodies. Alignment: Mild levocurvature of the lumbar spine. Trace anterolisthesis of L4 on L5. Vertebrae: Compression fracture of L2, likely acute, asymmetric to the right, with approximately 15% vertebral body height loss, and additional endplate fracture at the anterior right aspect (series 5, image [id number])). No retropulsion or involvement of the posterior elements. Other vertebral body heights are preserved. No suspicious osseous lesion. Paraspinal and other soft tissues: Aortic atherosclerosis. Nonobstructing left renal stones. Disc levels: T12-L1: Minimal disc bulge. Mild facet arthropathy. No spinal canal stenosis or neural foraminal narrowing. L1-L2: Minimal disc bulge. Mild facet arthropathy. No spinal canal stenosis or neural foraminal narrowing. L2-L3: Minimal disc bulge. Mild facet arthropathy. No spinal canal stenosis or neural foraminal narrowing. L3-L4: Minimal disc bulge. Mild facet arthropathy. No spinal canal stenosis or neural foraminal narrowing. L4-L5: Trace anterolisthesis with disc unroofing and minimal disc bulge. Moderate facet arthropathy. Narrowing of the lateral recesses. No spinal  canal stenosis or neural foraminal narrowing. L5-S1: Minimal disc bulge. Moderate right and mild left facet arthropathy. No spinal canal stenosis or neural foraminal narrowing. IMPRESSION: 1. Compression fracture of L2, favored to be acute or subacute, with approximately 15% vertebral body height loss. No retropulsion or involvement of the posterior elements. 2. Aortic atherosclerosis. Aortic Atherosclerosis (ICD10-I70.0). Electronically Signed   By: Merilyn Baba M.D.   On: 06/14/2022 20:25   DG Lumbar Spine Complete  Result Date: 06/14/2022 CLINICAL DATA:  Fall with lower back pain EXAM: LUMBAR SPINE - COMPLETE 5 VIEW COMPARISON:  CT abdomen and pelvis dated 03/26/2022 FINDINGS: Apparent cortical step-off at the level of S2. Similar fragmented appearance of the L4 spinous process. Alignment is otherwise normal. Intervertebral disc spaces are maintained. Cholecystectomy clips. Ovoid calcification in the left lower quadrant correlates to the left ovarian vein. IMPRESSION: Apparent cortical step-off at the level of S2, which may represent a fracture. Electronically Signed   By: Darrin Nipper M.D.   On: 06/14/2022 15:40   DG Shoulder Left  Result Date: 06/14/2022 CLINICAL DATA:  Fall pain EXAM: LEFT SHOULDER - 2+ VIEW COMPARISON:  None Available. FINDINGS: No fracture or dislocation of the left shoulder. Evaluation of the acromioclavicular joint is limited by patient positioning. Moderate glenohumeral arthrosis. Partially imaged left chest unremarkable. IMPRESSION: 1. No fracture or dislocation of the left shoulder. 2. Moderate glenohumeral arthrosis. Evaluation of the acromioclavicular joint is limited by patient positioning. Electronically Signed   By: Delanna Ahmadi M.D.   On: 06/14/2022 15:36   CT Head Wo Contrast  Result Date: 06/14/2022 CLINICAL DATA:  Fall EXAM: CT HEAD WITHOUT CONTRAST TECHNIQUE: Contiguous axial images were obtained from the base of the skull through the vertex without intravenous  contrast. RADIATION DOSE REDUCTION: This exam was performed according to the departmental dose-optimization program which includes automated exposure control, adjustment of the mA and/or kV according to patient size and/or use of iterative reconstruction technique. COMPARISON:  06/23/2016 FINDINGS: Brain: No evidence of acute infarction, hemorrhage, hydrocephalus, or extra-axial collection. Unchanged small hyperdense lesion at the medial temporal pole (series 2, image 23). Vascular: No hyperdense  vessel or unexpected calcification. Skull: Normal. Negative for fracture or focal lesion. Sinuses/Orbits: No acute finding. Other: None. IMPRESSION: 1. No acute intracranial pathology. 2. Unchanged small hyperdense lesion at the medial temporal pole, previously characterized as a cavernous hemangioma, for which no further follow-up or characterization is required. Electronically Signed   By: Delanna Ahmadi M.D.   On: 06/14/2022 15:14    Procedures Procedures    Medications Ordered in ED Medications  acetaminophen (TYLENOL) tablet 650 mg (650 mg Oral Given 06/14/22 2008)    ED Course/ Medical Decision Making/ A&P                           Medical Decision Making Amount and/or Complexity of Data Reviewed Radiology: ordered.  Risk OTC drugs.   This patient presents to the ED with chief complaint(s) of fall, low back pain with pertinent past medical history of af, htn which further complicates the presenting complaint. The complaint involves an extensive differential diagnosis and also carries with it a high risk of complications and morbidity.    The differential diagnosis includes but not limited to Differential diagnoses for head trauma includes subdural hematoma, epidural hematoma, acute concussion, traumatic subarachnoid hemorrhage, cerebral contusions, sprain, strain, soft tissue injury, fx,  etc. . Serious etiologies were considered.   The initial plan is to screening imaging, analgesia     Additional history obtained: Additional history obtained from family Records reviewed  prior ed visits, prior labs/imaging, primary care notes  home meds  Independent labs interpretation:  The following labs were independently interpreted: na  Independent visualization of imaging: - I independently visualized the following imaging with scope of interpretation limited to determining acute life threatening conditions related to emergency care: CT head, CT lumbar, lumbar x-ray, shoulder left x-ray, which revealed L2 compression fracture, no retropulsion imaging otherwise unremarkable  Cardiac monitoring was reviewed and interpreted by myself which shows na  Treatment and Reassessment: Apap >> improved  Consultation: - Consulted or discussed management/test interpretation w/ external professional: na  Consideration for admission or further workup: Admission was considered   Pt with GLF, low back injury with L2 compression fx, acute vs subacute. She has no sig ttp at this level, neurologically intact, pain well controlled with tylenol. Ambulatory w/ steady gait. Given lack of ttp to this area this seems more likely subacute. Given nsgy f/u, discussed supportive care at home  The patient improved significantly and was discharged in stable condition. Detailed discussions were had with the patient regarding current findings, and need for close f/u with PCP or on call doctor. The patient has been instructed to return immediately if the symptoms worsen in any way for re-evaluation. Patient verbalized understanding and is in agreement with current care plan. All questions answered prior to discharge.    Social Determinants of health: Social History   Tobacco Use   Smoking status: Never   Smokeless tobacco: Never  Vaping Use   Vaping Use: Never used  Substance Use Topics   Alcohol use: No   Drug use: No            Final Clinical Impression(s) / ED Diagnoses Final diagnoses:   Fall, initial encounter  Closed compression fracture of L2 lumbar vertebra, initial encounter [facility])    Rx / DC Orders ED Discharge Orders     None         Jeanell Sparrow, DO 06/14/22 2357

## 2022-06-14 NOTE — ED Notes (Signed)
Pt has no open skin wounds from fall. L shoulder edematous, but no bruising. Pain in lower back 7/10.

## 2022-06-16 ENCOUNTER — Emergency Department (HOSPITAL_BASED_OUTPATIENT_CLINIC_OR_DEPARTMENT_OTHER): Payer: Medicare PPO

## 2022-06-16 ENCOUNTER — Emergency Department (HOSPITAL_BASED_OUTPATIENT_CLINIC_OR_DEPARTMENT_OTHER)
Admission: EM | Admit: 2022-06-16 | Discharge: 2022-06-16 | Disposition: A | Discharge status: Home / Self Care | Payer: Medicare PPO | Attending: Emergency Medicine | Admitting: Emergency Medicine

## 2022-06-16 ENCOUNTER — Encounter (HOSPITAL_BASED_OUTPATIENT_CLINIC_OR_DEPARTMENT_OTHER): Payer: Self-pay | Admitting: Emergency Medicine

## 2022-06-16 ENCOUNTER — Other Ambulatory Visit: Payer: Self-pay

## 2022-06-16 DIAGNOSIS — M25552 Pain in left hip: Secondary | ICD-10-CM | POA: Diagnosis present

## 2022-06-16 MED ORDER — ONDANSETRON 4 MG PO TBDP
4.0000 mg | ORAL_TABLET | Freq: Once | ORAL | Status: AC
  Administered 2022-06-16: 4 mg via ORAL
  Filled 2022-06-16: qty 1

## 2022-06-16 MED ORDER — OXYCODONE HCL 5 MG PO TABS
5.0000 mg | ORAL_TABLET | Freq: Once | ORAL | Status: AC
  Administered 2022-06-16: 5 mg via ORAL
  Filled 2022-06-16: qty 1

## 2022-06-16 MED ORDER — LIDOCAINE 5 % EX PTCH: 1.0000 | MEDICATED_PATCH | CUTANEOUS | 0 refills | Status: DC

## 2022-06-16 MED ORDER — ONDANSETRON HCL 4 MG PO TABS: 4.0000 mg | ORAL_TABLET | Freq: Four times a day (QID) | ORAL | 0 refills | Status: DC

## 2022-06-16 MED ORDER — OXYCODONE HCL 5 MG PO TABS: 5.0000 mg | ORAL_TABLET | ORAL | 0 refills | Status: DC | PRN

## 2022-06-16 NOTE — Discharge Instructions (Signed)
Follow-up with orthopedics.  I have prescribed your oxycodone for breakthrough pain.  Recommend using lidocaine patches, Tylenol for your pain.

## 2022-06-16 NOTE — ED Notes (Signed)
Patient transported to ct

## 2022-06-16 NOTE — ED Notes (Signed)
Patient transported to X-ray 

## 2022-06-16 NOTE — ED Triage Notes (Signed)
States slipped and fell at home on Friday. No LOC. Eval at [facility] ED on Friday. Now having pain to left hip with wt bearing

## 2022-06-16 NOTE — ED Notes (Signed)
ED Provider at bedside. 

## 2022-06-16 NOTE — ED Provider Notes (Signed)
[facility] DEPART[facility] Provider Note   CSN: [id number] Arrival date & time: 06/16/22  2751     History  Chief Complaint  Patient presents with   Hip Pain    Gwendolyn Alvarado is a 76 y.o. female.  Is here with left hip pain.  She had a fall couple days ago and was found to have a compression fracture in her lumbar spine.  The last couple days she has not been having some more pain in her left hip and family member does not think that the hip was imaged at all.  She has been doing well with Tylenol at home.  Denies any weakness or numbness or chills.  Nothing makes it worse or better.  No new falls.  The history is provided by the patient.       Home Medications Prior to Admission medications   Medication Sig Start Date End Date Taking? Authorizing Provider  lidocaine (LIDODERM) 5 % Place 1 patch onto the skin daily. Remove & Discard patch within 12 hours or as directed by MD 06/16/22  Yes Kamesha Herne, DO  ondansetron (ZOFRAN) 4 MG tablet Take 1 tablet (4 mg total) by mouth every 6 (six) hours. 06/16/22  Yes Izayah Miner, DO  oxyCODONE (ROXICODONE) 5 MG immediate release tablet Take 1 tablet (5 mg total) by mouth every 4 (four) hours as needed for up to 10 doses for severe pain. 06/16/22  Yes Randon Somera, DO  acetaminophen (TYLENOL) 325 MG tablet Take 650 mg by mouth every 6 (six) hours as needed.    [provider]  ciprofloxacin (CIPRO) 500 MG tablet Take 1 tablet (500 mg total) by mouth 2 (two) times daily. 01/24/22   Irene Shipper, MD  DEXILANT 60 MG capsule Take 1 capsule (60 mg total) by mouth daily. 01/24/22   Irene Shipper, MD  diclofenac sodium (VOLTAREN) 1 % GEL Apply 2-4 g topically 3 (three) times daily as needed. 11/03/18   [provider]  dicyclomine (BENTYL) 10 MG capsule Take 1-2 capsules every 4-6 hours as needed 01/24/22   Irene Shipper, MD  flecainide (TAMBOCOR) 50 MG tablet TAKE 1 TABLET(50 MG) BY MOUTH TWICE DAILY.  09/03/21   Evans Lance, MD  Lacosamide 150 MG TABS Take 1 tablet by mouth 2 (two) times daily.    [provider]  metoprolol succinate (TOPROL-XL) 25 MG 24 hr tablet Take 1 tablet (25 mg total) by mouth daily. 09/03/21   Evans Lance, MD  Probiotic Product (Timblin) Take by mouth.    [provider]      Allergies    Carbamazepine, Dilantin [phenytoin sodium extended], Doxycycline, Phenytoin sodium extended, Vicodin [hydrocodone-acetaminophen], Phenytoin sodium, Hydrocodone-acetaminophen, and Penicillins    Review of Systems   Review of Systems  Physical Exam Updated Vital Signs BP (!) 158/62 (BP Location: Right Arm)   Pulse 64   Temp 98.2 F (36.8 C)   Resp 18   SpO2 94%  Physical Exam Vitals and nursing note reviewed.  Constitutional:      General: She is not in acute distress.    Appearance: She is well-developed. She is not ill-appearing.  HENT:     Head: Normocephalic and atraumatic.     Nose: Nose normal.     Mouth/Throat:     Mouth: Mucous membranes are moist.  Eyes:     Extraocular Movements: Extraocular movements intact.     Conjunctiva/sclera: Conjunctivae normal.  Pupils: Pupils are equal, round, and reactive to light.  Cardiovascular:     Rate and Rhythm: Normal rate and regular rhythm.     Pulses: Normal pulses.     Heart sounds: Normal heart sounds. No murmur heard. Pulmonary:     Effort: Pulmonary effort is normal. No respiratory distress.     Breath sounds: Normal breath sounds.  Abdominal:     Palpations: Abdomen is soft.     Tenderness: There is no abdominal tenderness.  Musculoskeletal:        General: Tenderness present. No swelling.     Cervical back: Normal range of motion and neck supple.     Comments: Tenderness to the left hip  Skin:    General: Skin is warm and dry.     Capillary Refill: Capillary refill takes less than 2 seconds.  Neurological:     General: No focal deficit present.     Mental  Status: She is alert and oriented to person, place, and time.     Cranial Nerves: No cranial nerve deficit.     Sensory: No sensory deficit.     Motor: No weakness.     Coordination: Coordination normal.     Comments: 5+ out of 5 strength throughout, normal sensation, no drift, normal finger-nose-finger, normal speech  Psychiatric:        Mood and Affect: Mood normal.     ED Results / Procedures / Treatments   Labs (all labs ordered are listed, but only abnormal results are displayed) Labs Reviewed - No data to display  EKG None  Radiology CT PELVIS WO CONTRAST  Result Date: 06/16/2022 CLINICAL DATA:  Trauma, fall, pain left hip EXAM: CT PELVIS WITHOUT CONTRAST TECHNIQUE: Multidetector CT imaging of the pelvis was performed following the standard protocol without intravenous contrast. RADIATION DOSE REDUCTION: This exam was performed according to the departmental dose-optimization program which includes automated exposure control, adjustment of the mA and/or kV according to patient size and/or use of iterative reconstruction technique. COMPARISON:  CT abdomen and pelvis done on 03/26/2022 FINDINGS: Urinary Tract:  Urinary bladder is not distended. Bowel: Multiple diverticula seen in visualized portions of colon. There is no bowel wall thickening. Vascular/Lymphatic: Unremarkable. Reproductive:  Uterus is not seen. Other: There is no free fluid in pelvis. Small umbilical hernia containing fat is seen. Musculoskeletal: No displaced fractures are seen. Bony spurs are seen in pubic symphysis. IMPRESSION: No recent displaced fracture or dislocation is seen. There is no free fluid in pelvis. There is no demonstrable soft tissue hematoma. Diverticulosis of colon.  Small umbilical hernia containing fat. Electronically Signed   By: Elmer Picker M.D.   On: 06/16/2022 10:13   DG Hip Unilat With Pelvis 2-3 Views Left  Result Date: 06/16/2022 CLINICAL DATA:  76 year old female with history of  fall complaining of left-sided hip pain. EXAM: DG HIP (WITH OR WITHOUT PELVIS) 2-3V LEFT COMPARISON:  No priors. FINDINGS: Bony pelvic ring appears intact. Bilateral proximal femurs as visualized are intact, and the left femoral head is located. There is mild joint space narrowing, subchondral sclerosis and osteophyte formation in the hip joints bilaterally indicative of osteoarthritis. IMPRESSION: 1. No acute radiographic abnormality of the bony pelvis or the left hip. 2. Mild bilateral hip joint osteoarthritis. Electronically Signed   By: Vinnie Langton M.D.   On: 06/16/2022 09:23   CT Lumbar Spine Wo Contrast  Result Date: 06/14/2022 CLINICAL DATA:  Slip and fall at home, low back pain EXAM: CT LUMBAR  SPINE WITHOUT CONTRAST TECHNIQUE: Multidetector CT imaging of the lumbar spine was performed without intravenous contrast administration. Multiplanar CT image reconstructions were also generated. RADIATION DOSE REDUCTION: This exam was performed according to the departmental dose-optimization program which includes automated exposure control, adjustment of the mA and/or kV according to patient size and/or use of iterative reconstruction technique. COMPARISON:  Prior dedicated CT lumbar spine, correlation is made with CT abdomen pelvis 03/26/2022 FINDINGS: Segmentation: 5 lumbar-type vertebral bodies. Alignment: Mild levocurvature of the lumbar spine. Trace anterolisthesis of L4 on L5. Vertebrae: Compression fracture of L2, likely acute, asymmetric to the right, with approximately 15% vertebral body height loss, and additional endplate fracture at the anterior right aspect (series 5, image [id number])). No retropulsion or involvement of the posterior elements. Other vertebral body heights are preserved. No suspicious osseous lesion. Paraspinal and other soft tissues: Aortic atherosclerosis. Nonobstructing left renal stones. Disc levels: T12-L1: Minimal disc bulge. Mild facet arthropathy. No spinal canal stenosis or  neural foraminal narrowing. L1-L2: Minimal disc bulge. Mild facet arthropathy. No spinal canal stenosis or neural foraminal narrowing. L2-L3: Minimal disc bulge. Mild facet arthropathy. No spinal canal stenosis or neural foraminal narrowing. L3-L4: Minimal disc bulge. Mild facet arthropathy. No spinal canal stenosis or neural foraminal narrowing. L4-L5: Trace anterolisthesis with disc unroofing and minimal disc bulge. Moderate facet arthropathy. Narrowing of the lateral recesses. No spinal canal stenosis or neural foraminal narrowing. L5-S1: Minimal disc bulge. Moderate right and mild left facet arthropathy. No spinal canal stenosis or neural foraminal narrowing. IMPRESSION: 1. Compression fracture of L2, favored to be acute or subacute, with approximately 15% vertebral body height loss. No retropulsion or involvement of the posterior elements. 2. Aortic atherosclerosis. Aortic Atherosclerosis (ICD10-I70.0). Electronically Signed   By: Merilyn Baba M.D.   On: 06/14/2022 20:25   DG Lumbar Spine Complete  Result Date: 06/14/2022 CLINICAL DATA:  Fall with lower back pain EXAM: LUMBAR SPINE - COMPLETE 5 VIEW COMPARISON:  CT abdomen and pelvis dated 03/26/2022 FINDINGS: Apparent cortical step-off at the level of S2. Similar fragmented appearance of the L4 spinous process. Alignment is otherwise normal. Intervertebral disc spaces are maintained. Cholecystectomy clips. Ovoid calcification in the left lower quadrant correlates to the left ovarian vein. IMPRESSION: Apparent cortical step-off at the level of S2, which may represent a fracture. Electronically Signed   By: Darrin Nipper M.D.   On: 06/14/2022 15:40   DG Shoulder Left  Result Date: 06/14/2022 CLINICAL DATA:  Fall pain EXAM: LEFT SHOULDER - 2+ VIEW COMPARISON:  None Available. FINDINGS: No fracture or dislocation of the left shoulder. Evaluation of the acromioclavicular joint is limited by patient positioning. Moderate glenohumeral arthrosis. Partially  imaged left chest unremarkable. IMPRESSION: 1. No fracture or dislocation of the left shoulder. 2. Moderate glenohumeral arthrosis. Evaluation of the acromioclavicular joint is limited by patient positioning. Electronically Signed   By: Delanna Ahmadi M.D.   On: 06/14/2022 15:36   CT Head Wo Contrast  Result Date: 06/14/2022 CLINICAL DATA:  Fall EXAM: CT HEAD WITHOUT CONTRAST TECHNIQUE: Contiguous axial images were obtained from the base of the skull through the vertex without intravenous contrast. RADIATION DOSE REDUCTION: This exam was performed according to the departmental dose-optimization program which includes automated exposure control, adjustment of the mA and/or kV according to patient size and/or use of iterative reconstruction technique. COMPARISON:  06/23/2016 FINDINGS: Brain: No evidence of acute infarction, hemorrhage, hydrocephalus, or extra-axial collection. Unchanged small hyperdense lesion at the medial temporal pole (series 2, image 23). Vascular: No  hyperdense vessel or unexpected calcification. Skull: Normal. Negative for fracture or focal lesion. Sinuses/Orbits: No acute finding. Other: None. IMPRESSION: 1. No acute intracranial pathology. 2. Unchanged small hyperdense lesion at the medial temporal pole, previously characterized as a cavernous hemangioma, for which no further follow-up or characterization is required. Electronically Signed   By: Delanna Ahmadi M.D.   On: 06/14/2022 15:14    Procedures Procedures    Medications Ordered in ED Medications  oxyCODONE (Oxy IR/ROXICODONE) immediate release tablet 5 mg (5 mg Oral Given 06/16/22 0913)  ondansetron (ZOFRAN-ODT) disintegrating tablet 4 mg (4 mg Oral Given 06/16/22 0913)    ED Course/ Medical Decision Making/ A&P                           Medical Decision Making Amount and/or Complexity of Data Reviewed Radiology: ordered.  Risk Prescription drug management.   Gwendolyn Alvarado is here with left hip pain.  Fall 3  days ago.  Was diagnosed with a compression fracture 3 days ago.  But started to have some left hip pain since the fall.  Overall x-ray was obtained that showed no fracture.  CT scan was obtained that showed no fracture.  She is neurovascular neuromuscular intact.  Overall suspect that this is a contusion or musculoskeletal soft tissue process.  She appears very well.  Her back pain is improved does not want a TLSO brace which I think is reasonable.  We will prescribe her oxycodone for breakthrough pain.  Will prescribe Zofran and lidocaine patches as well.  She understands return precautions and will follow-up with orthopedics.  Discharged in good condition.  This chart was dictated using voice recognition software.  Despite best efforts to proofread,  errors can occur which can change the documentation meaning.         Final Clinical Impression(s) / ED Diagnoses Final diagnoses:  Left hip pain    Rx / DC Orders ED Discharge Orders          Ordered    oxyCODONE (ROXICODONE) 5 MG immediate release tablet  Every 4 hours PRN        06/16/22 1018    lidocaine (LIDODERM) 5 %  Every 24 hours        06/16/22 1018    ondansetron (ZOFRAN) 4 MG tablet  Every 6 hours        06/16/22 1018              Angeleah Labrake, DO 06/16/22 1020

## 2022-07-03 ENCOUNTER — Other Ambulatory Visit: Payer: Self-pay | Admitting: Orthopedic Surgery

## 2022-07-03 DIAGNOSIS — M545 Low back pain, unspecified: Secondary | ICD-10-CM

## 2022-07-06 ENCOUNTER — Ambulatory Visit
Admission: RE | Admit: 2022-07-06 | Discharge: 2022-07-06 | Disposition: A | Discharge status: Home / Self Care | Payer: Medicare PPO | Source: Ambulatory Visit | Attending: Orthopedic Surgery | Admitting: Orthopedic Surgery

## 2022-07-06 DIAGNOSIS — M545 Low back pain, unspecified: Secondary | ICD-10-CM

## 2022-07-10 ENCOUNTER — Other Ambulatory Visit: Payer: Medicare PPO

## 2022-07-18 ENCOUNTER — Other Ambulatory Visit: Payer: Medicare PPO

## 2022-08-16 ENCOUNTER — Encounter: Payer: Self-pay | Admitting: Obstetrics and Gynecology

## 2022-08-16 ENCOUNTER — Ambulatory Visit: Payer: Medicare PPO | Admitting: Obstetrics and Gynecology

## 2022-08-16 VITALS — BP 126/68 | HR 79 | Ht 64.0 in | Wt 161.0 lb

## 2022-08-16 DIAGNOSIS — N816 Rectocele: Secondary | ICD-10-CM | POA: Diagnosis not present

## 2022-08-16 DIAGNOSIS — B3731 Acute candidiasis of vulva and vagina: Secondary | ICD-10-CM | POA: Diagnosis not present

## 2022-08-16 DIAGNOSIS — N762 Acute vulvitis: Secondary | ICD-10-CM

## 2022-08-16 DIAGNOSIS — N9089 Other specified noninflammatory disorders of vulva and perineum: Secondary | ICD-10-CM

## 2022-08-16 LAB — WET PREP FOR TRICH, YEAST, CLUE

## 2022-08-16 MED ORDER — FLUCONAZOLE 150 MG PO TABS: 150.0000 mg | ORAL_TABLET | Freq: Once | ORAL | 0 refills | Status: AC

## 2022-08-16 MED ORDER — BETAMETHASONE VALERATE 0.1 % EX OINT: TOPICAL_OINTMENT | CUTANEOUS | 0 refills | Status: DC

## 2022-08-16 NOTE — Progress Notes (Signed)
77 y.o. No obstetric history on file. Married White or Caucasian Not Hispanic or Latino female here for a vaginal bump. She states that its not painful. She noticed a non tender bump on the right side of her vagina 1.5 weeks ago. No change in size.  She does have some vulvar itching, no D/C  She has IBS, goes from constipation to diarrhea.   No bladder c/o.  Patient denies needing assistance to get on table. We discussed fall risks.     No LMP recorded. Patient has had a hysterectomy.          Sexually active: Yes.    The current method of family planning is status post hysterectomy and post menopausal status.    Exercising: No.  The patient does not participate in regular exercise at present. Smoker:  no  Health Maintenance: Pap:  unsure  History of abnormal Pap:  no MMG:  11/20/21  Density B Bi-rads 1 neg  BMD:   12/06/19 osteopenic T-score -2.2  Colonoscopy: 12/12/16 aged out  TDaP:  2018 Gardasil: n/a   reports that she has never smoked. She has never used smokeless tobacco. She reports that she does not drink alcohol and does not use drugs.  Past Medical History:  Diagnosis Date   Angioma cavernosum    Atrial fibrillation (HCC)    Colon polyps    Diverticulitis    Diverticulosis    HTN (hypertension)    Hypotension 06/23/2016   Irritable bowel syndrome (IBS)    Seizure disorder (HCC)     Past Surgical History:  Procedure Laterality Date   ABDOMINAL HYSTERECTOMY     Bartholins gland cyst resection     BREAST EXCISIONAL BIOPSY Right    benign   CERVICAL DISC SURGERY     C2   CHOLECYSTECTOMY     SALIVARY GLAND SURGERY      Current Outpatient Medications  Medication Sig Dispense Refill   acetaminophen (TYLENOL) 325 MG tablet Take 650 mg by mouth every 6 (six) hours as needed.     DEXILANT 60 MG capsule Take 1 capsule (60 mg total) by mouth daily. 90 capsule 3   diclofenac sodium (VOLTAREN) 1 % GEL Apply 2-4 g topically 3 (three) times daily as needed.      dicyclomine (BENTYL) 10 MG capsule Take 1-2 capsules every 4-6 hours as needed 60 capsule 6   flecainide (TAMBOCOR) 50 MG tablet TAKE 1 TABLET(50 MG) BY MOUTH TWICE DAILY. 180 tablet 3   Lacosamide 150 MG TABS Take 1 tablet by mouth 2 (two) times daily.     metoprolol succinate (TOPROL-XL) 25 MG 24 hr tablet Take 1 tablet (25 mg total) by mouth daily. 90 tablet 3   No current facility-administered medications for this visit.    Family History  Problem Relation Age of Onset   Stroke Mother    Lung cancer Mother    Breast cancer Paternal Aunt    Seizures Neg Hx     Review of Systems  All other systems reviewed and are negative.   Exam:   BP 126/68   Pulse 79   Ht '5\' 4"'$  (1.626 m)   Wt 161 lb (73 kg)   SpO2 100%   BMI 27.64 kg/m   Weight change: '@WEIGHTCHANGE'$ @ Height:   Height: '5\' 4"'$  (162.6 cm)  Ht Readings from Last 3 Encounters:  08/16/22 '5\' 4"'$  (1.626 m)  06/14/22 '5\' 6"'$  (1.676 m)  01/24/22 5' 3.25" (1.607 m)    General appearance:  alert, cooperative and appears stated age Head: Normocephalic, without obvious abnormality, atraumatic Neurologic: Grossly normal   Pelvic: External genitalia: in the posterior fourchette is an ~1 cm area with increased pigmentation, irregular boarders (patient unaware of it), suspect melanoisis.               Urethra:  normal appearing urethra with no masses, tenderness or lesions              Bartholins and Skenes: normal                 Vagina: mildly atrophic appearing vagina with normal color, no discharge. With valsalva she has a small, grade 2 rectocele, no significant vaginal apex or bladder prolapse.               Cervix: absent               Bimanual Exam:  Uterus:  uterus absent              Adnexa: no mass, fullness, tenderness                Gae Dry chaperoned for the exam.  1. Rectocele Reviewed ACOG handout on genital prolapse. Currently not bothersome, no treatment needed at this time. Try and avoid heavy lifting and  straining. Discussed reducing the rectocele if needed to defecate.  -Reviewed options of pessary or surgery if needed.   2. Vulvar lesion Suspect melanosis, return for biopsy - Biopsy vulva; Future  3. Acute vulvitis - WET PREP FOR TRICH, YEAST, CLUE  4. Yeast vaginitis - fluconazole (DIFLUCAN) 150 MG tablet; Take 1 tablet (150 mg total) by mouth once for 1 dose. Take one tablet.  Repeat in 72 hours if symptoms are not completely resolved.  Dispense: 2 tablet; Refill: 0 - betamethasone valerate ointment (VALISONE) 0.1 %; Apply a pea sized amount topically BID for up to 2 weeks as needed.  Dispense: 30 g; Refill: 0

## 2022-08-16 NOTE — Patient Instructions (Signed)
Vaginal Yeast Infection, Adult  Vaginal yeast infection is a condition that causes vaginal discharge as well as soreness, swelling, and redness (inflammation) of the vagina. This is a common condition. Some women get this infection frequently. What are the causes? This condition is caused by a change in the normal balance of the yeast (Candida) and normal bacteria that live in the vagina. This change causes an overgrowth of yeast, which causes the inflammation. What increases the risk? The condition is more likely to develop in women who: Take antibiotic medicines. Have diabetes. Take birth control pills. Are pregnant. Douche often. Have a weak body defense system (immune system). Have been taking steroid medicines for a long time. Frequently wear tight clothing. What are the signs or symptoms? Symptoms of this condition include: White, thick, creamy vaginal discharge. Swelling, itching, redness, and irritation of the vagina. The lips of the vagina (labia) may be affected as well. Pain or a burning feeling while urinating. Pain during sex. How is this diagnosed? This condition is diagnosed based on: Your medical history. A physical exam. A pelvic exam. Your health care provider will examine a sample of your vaginal discharge under a microscope. Your health care provider may send this sample for testing to confirm the diagnosis. How is this treated? This condition is treated with medicine. Medicines may be over-the-counter or prescription. You may be told to use one or more of the following: Medicine that is taken by mouth (orally). Medicine that is applied as a cream (topically). Medicine that is inserted directly into the vagina (suppository). Follow these instructions at home: Take or apply over-the-counter and prescription medicines only as told by your health care provider. Do not use tampons until your health care provider approves. Do not have sex until your infection has  cleared. Sex can prolong or worsen your symptoms of infection. Ask your health care provider when it is safe to resume sexual activity. Keep all follow-up visits. This is important. How is this prevented?  Do not wear tight clothes, such as pantyhose or tight pants. Wear breathable cotton underwear. Do not use douches, perfumed soap, creams, or powders. Wipe from front to back after using the toilet. If you have diabetes, keep your blood sugar levels under control. Ask your health care provider for other ways to prevent yeast infections. Contact a health care provider if: You have a fever. Your symptoms go away and then return. Your symptoms do not get better with treatment. Your symptoms get worse. You have new symptoms. You develop blisters in or around your vagina. You have blood coming from your vagina and it is not your menstrual period. You develop pain in your abdomen. Summary Vaginal yeast infection is a condition that causes discharge as well as soreness, swelling, and redness (inflammation) of the vagina. This condition is treated with medicine. Medicines may be over-the-counter or prescription. Take or apply over-the-counter and prescription medicines only as told by your health care provider. Do not douche. Resume sexual activity or use of tampons as instructed by your health care provider. Contact a health care provider if your symptoms do not get better with treatment or your symptoms go away and then return. This information is not intended to replace advice given to you by your health care provider. Make sure you discuss any questions you have with your health care provider. Document Revised: 10/09/2020 Document Reviewed: 10/09/2020 Elsevier Patient Education  [place]. About Rectocele  Overview  A rectocele is a type of hernia  which causes different degrees of bulging of the rectal tissues into the vaginal wall.  You may even notice that it presses against  the vaginal wall so much that some vaginal tissues droop outside of the opening of your vagina.  Causes of Rectocele  The most common cause is childbirth.  The muscles and ligaments in the pelvis that hold up and support the female organs and vagina become stretched and weakened during labor and delivery.  The more babies you have, the more the support tissues are stretched and weakened.  Not everyone who has a baby will develop a rectocele.  Some women have stronger supporting tissue in the pelvis and may not have as much of a problem as others.  Women who have a Cesarean section usually do not get rectocele's unless they pushed a long time prior to the cesarean delivery.  Other conditions that can cause a rectocele include chronic constipation, a chronic cough, a lot of heavy lifting, and obesity.  Older women may have this problem because the loss of female hormones causes the vaginal tissue to become weaker.  Symptoms  There may not be any symptoms.  If you do have symptoms, they may include: Pelvic pressure in the rectal area Protrusion of the lower part of the vagina through the opening of the vagina Constipation and trapping of the stool, making it difficult to have a bowel movement.  In severe cases, you may have to press on the lower part of your vagina to help push the stool out of you rectum.  This is called splinting to empty.  Diagnosing Rectocele  Your health care provider will ask about your symptoms and perform a pelvic exam.  S/he will ask you to bear down, pushing like you are having a bowel movement so as to see how far the lower part of the vagina protrudes into the vagina and possible outside of the vagina.  Your provider will also ask you to contract the muscles of your pelvis (like you are stopping the stream in the middle of urinating) to determine the strength of your pelvic muscles.  Your provider may also do a rectal exam.  Treatment Options  If you do not have any  symptoms, no treatment may be necessary.  Other treatment options include: Pelvic floor exercises: Contracting the muscles in your genital area may help strengthen your muscles and support the organs.  Be sure to get proper exercise instruction from you physical therapist. A pessary (removealbe pelvic support device) sometimes helps rectocele symptoms. Surgery: Surgical repair may be necessary. In some cases the uterus may need to be taken out ( a hysterectomy) as well.  There are many types of surgery for pelvic support problems.  Look for physicians who specialize in repair procedures.  You can take care of yourself by: Treating and preventing constipation Avoiding heavy lifting, and lifting correctly (with your legs, not with you waist or back) Treating a chronic cough or bronchitis Not smoking avoiding too much weight gain Doing pelvic floor exercises   2007, [employer] Doc.[email or web address]

## 2022-08-19 DIAGNOSIS — M545 Low back pain, unspecified: Secondary | ICD-10-CM | POA: Diagnosis not present

## 2022-09-02 ENCOUNTER — Ambulatory Visit: Payer: Medicare PPO | Admitting: Family Medicine

## 2022-09-03 ENCOUNTER — Encounter: Payer: Self-pay | Admitting: Internal Medicine

## 2022-09-03 ENCOUNTER — Ambulatory Visit: Payer: Medicare PPO | Attending: Internal Medicine | Admitting: Internal Medicine

## 2022-09-03 ENCOUNTER — Encounter: Payer: Self-pay | Admitting: Obstetrics and Gynecology

## 2022-09-03 VITALS — BP 134/80 | HR 65 | Ht 64.0 in | Wt 157.0 lb

## 2022-09-03 DIAGNOSIS — I4891 Unspecified atrial fibrillation: Secondary | ICD-10-CM | POA: Diagnosis not present

## 2022-09-03 DIAGNOSIS — D1800 Hemangioma unspecified site: Secondary | ICD-10-CM

## 2022-09-03 DIAGNOSIS — I10 Essential (primary) hypertension: Secondary | ICD-10-CM

## 2022-09-03 MED ORDER — METOPROLOL SUCCINATE ER 25 MG PO TB24: 25.0000 mg | ORAL_TABLET | Freq: Every day | ORAL | 3 refills | Status: DC

## 2022-09-03 MED ORDER — FLECAINIDE ACETATE 50 MG PO TABS: ORAL_TABLET | ORAL | 3 refills | Status: DC

## 2022-09-03 NOTE — Progress Notes (Signed)
HPI Mrs. Ensey returns today for follow-up. She is a 77 year old woman with paroxysmal atrial fibrillation who has been nicely controlled with flecainide and beta blocker therapy.  She is not on systemic anticoagulation for her atrial fibrillation with her h/o cavernous hemangioma. She denies chest pain or shortness of breath. She has had no symptomatic atrial fibrillation on medical therapy.  She notes that she fell (tripped) and fractured her lumbar spine and has had very bad pain just getting better in the last couple of weeks.  Allergies  Allergen Reactions   Carbamazepine Hives and Other (See Comments)    Blisters similar to Katherina Right Syndrome   Dilantin [Phenytoin Sodium Extended] Hives and Other (See Comments)    Blisters similar to Remo Lipps Johnston's Syndrome Blisters   Doxycycline Nausea And Vomiting and Other (See Comments)    Made her sick for about 6 weeks & lost 30 lbs Made her sick for about 6 weeks & lost 30 lbs   Phenytoin Sodium Extended Other (See Comments) and Hives    Other reaction(s): Hives blisters  Blisters similar to [employer] Syndrome   Vicodin [Hydrocodone-Acetaminophen] Other (See Comments) and Nausea And Vomiting   Phenytoin Sodium    Hydrocodone-Acetaminophen Nausea And Vomiting   Penicillins Itching and Rash    Other reaction(s): Itching     Current Outpatient Medications  Medication Sig Dispense Refill   acetaminophen (TYLENOL) 325 MG tablet Take 650 mg by mouth every 6 (six) hours as needed.     betamethasone valerate ointment (VALISONE) 0.1 % Apply a pea sized amount topically BID for up to 2 weeks as needed. 30 g 0   DEXILANT 60 MG capsule Take 1 capsule (60 mg total) by mouth daily. 90 capsule 3   diclofenac sodium (VOLTAREN) 1 % GEL Apply 2-4 g topically 3 (three) times daily as needed.     dicyclomine (BENTYL) 10 MG capsule Take 1-2 capsules every 4-6 hours as needed 60 capsule 6   flecainide (TAMBOCOR) 50 MG tablet TAKE 1  TABLET(50 MG) BY MOUTH TWICE DAILY. 180 tablet 3   Lacosamide 150 MG TABS Take 1 tablet by mouth 2 (two) times daily.     metoprolol succinate (TOPROL-XL) 25 MG 24 hr tablet Take 1 tablet (25 mg total) by mouth daily. 90 tablet 3   No current facility-administered medications for this visit.     Past Medical History:  Diagnosis Date   Angioma cavernosum    Atrial fibrillation ([place])    Colon polyps    Diverticulitis    Diverticulosis    HTN (hypertension)    Hypotension 06/23/2016   Irritable bowel syndrome (IBS)    Seizure disorder ([place])     ROS:   All systems reviewed and negative except as noted in the HPI.   Past Surgical History:  Procedure Laterality Date   ABDOMINAL HYSTERECTOMY     Bartholins gland cyst resection     BREAST EXCISIONAL BIOPSY Right    benign   CERVICAL DISC SURGERY     C2   CHOLECYSTECTOMY     SALIVARY GLAND SURGERY       Family History  Problem Relation Age of Onset   Stroke Mother    Lung cancer Mother    Breast cancer Paternal Aunt    Seizures Neg Hx      Social History   Socioeconomic History   Marital status: Married    Spouse name: Not on file   Number of children:  3   Years of education: Not on file   Highest education level: Not on file  Occupational History   Occupation: retired  Tobacco Use   Smoking status: Never   Smokeless tobacco: Never  Vaping Use   Vaping Use: Never used  Substance and Sexual Activity   Alcohol use: No   Drug use: No   Sexual activity: Yes  Other Topics Concern   Not on file  Social History Narrative   Not on file   Social Determinants of Health   Financial Resource Strain: Not on file  Food Insecurity: Not on file  Transportation Needs: Not on file  Physical Activity: Not on file  Stress: Not on file  Social Connections: Not on file  Intimate Partner Violence: Not on file     BP 134/80   Pulse 65   Ht '5\' 4"'$  (1.626 m)   Wt 157 lb (71.2 kg)   SpO2 96%   BMI 26.95 kg/m    Physical Exam:  Well appearing NAD HEENT: Unremarkable Neck:  No JVD, no thyromegally Lymphatics:  No adenopathy Back:  No CVA tenderness Lungs:  Clear with no wheezes HEART:  Regular rate rhythm, no murmurs, no rubs, no clicks Abd:  soft, positive bowel sounds, no organomegally, no rebound, no guarding Ext:  2 plus pulses, no edema, no cyanosis, no clubbing Skin:  No rashes no nodules Neuro:  CN II through XII intact, motor grossly intact  EKG - nsr with ILBBB   Assess/Plan:  PAF - she will continue flecainide.  Cavernous hemangioma - she is asymptomatic but will not undergo systemic anti-coagulation.    Carleene Overlie Adriell Polansky,MD

## 2022-09-03 NOTE — Patient Instructions (Addendum)
Medication Instructions:  Your physician recommends that you continue on your current medications as directed. Please refer to the Current Medication list given to you today. Your prescription of Flecainide and Toprol XL has been refilled.   *If you need a refill on your cardiac medications before your next appointment, please call your pharmacy*  Lab Work: None ordered.  If you have labs (blood work) drawn today and your tests are completely normal, you will receive your results only by: [place] (if you have MyChart) OR A paper copy in the mail If you have any lab test that is abnormal or we need to change your treatment, we will call you to review the results.  Testing/Procedures: None ordered.  Follow-Up: At [facility], you and your health needs are our priority.  As part of our continuing mission to provide you with exceptional heart care, we have created designated Provider Care Teams.  These Care Teams include your primary Cardiologist (physician) and Advanced Practice Providers (APPs -  Physician Assistants and Nurse Practitioners) who all work together to provide you with the care you need, when you need it.  We recommend signing up for the patient portal called "MyChart".  Sign up information is provided on this After Visit Summary.  MyChart is used to connect with patients for Virtual Visits (Telemedicine).  Patients are able to view lab/test results, encounter notes, upcoming appointments, etc.  Non-urgent messages can be sent to your provider as well.   To learn more about what you can do with MyChart, go to [email or web address].    Your next appointment:   1 year(s)  The format for your next appointment:   In Person  Provider:   Cristopher Peru, MD{or one of the following Advanced Practice Providers on your designated Care Team:   Tommye Standard, [place] Legrand Como "Jonni Sanger" Chalmers Cater, [place]   Important Information About Sugar

## 2022-09-04 ENCOUNTER — Other Ambulatory Visit: Payer: Self-pay | Admitting: Family Medicine

## 2022-09-04 DIAGNOSIS — M85851 Other specified disorders of bone density and structure, right thigh: Secondary | ICD-10-CM

## 2022-09-05 ENCOUNTER — Encounter: Payer: Medicare PPO | Admitting: Obstetrics and Gynecology

## 2022-09-05 ENCOUNTER — Ambulatory Visit
Admission: RE | Admit: 2022-09-05 | Discharge: 2022-09-05 | Disposition: A | Discharge status: Home / Self Care | Payer: Medicare PPO | Source: Ambulatory Visit | Attending: Family Medicine | Admitting: Family Medicine

## 2022-09-05 DIAGNOSIS — M810 Age-related osteoporosis without current pathological fracture: Secondary | ICD-10-CM | POA: Diagnosis not present

## 2022-09-05 DIAGNOSIS — Z780 Asymptomatic menopausal state: Secondary | ICD-10-CM | POA: Diagnosis not present

## 2022-09-05 DIAGNOSIS — M85831 Other specified disorders of bone density and structure, right forearm: Secondary | ICD-10-CM | POA: Diagnosis not present

## 2022-09-05 DIAGNOSIS — M85851 Other specified disorders of bone density and structure, right thigh: Secondary | ICD-10-CM

## 2022-09-11 ENCOUNTER — Ambulatory Visit: Payer: Medicare PPO | Admitting: Obstetrics and Gynecology

## 2022-09-11 ENCOUNTER — Encounter: Payer: Self-pay | Admitting: Internal Medicine

## 2022-09-27 DIAGNOSIS — M545 Low back pain, unspecified: Secondary | ICD-10-CM | POA: Diagnosis not present

## 2022-09-27 DIAGNOSIS — S161XXA Strain of muscle, fascia and tendon at neck level, initial encounter: Secondary | ICD-10-CM | POA: Diagnosis not present

## 2022-09-30 DIAGNOSIS — S32020D Wedge compression fracture of second lumbar vertebra, subsequent encounter for fracture with routine healing: Secondary | ICD-10-CM | POA: Diagnosis not present

## 2022-09-30 DIAGNOSIS — M533 Sacrococcygeal disorders, not elsewhere classified: Secondary | ICD-10-CM | POA: Diagnosis not present

## 2022-09-30 DIAGNOSIS — R531 Weakness: Secondary | ICD-10-CM | POA: Diagnosis not present

## 2022-10-01 ENCOUNTER — Telehealth: Payer: Self-pay | Admitting: Internal Medicine

## 2022-10-01 DIAGNOSIS — I10 Essential (primary) hypertension: Secondary | ICD-10-CM

## 2022-10-01 MED ORDER — FLECAINIDE ACETATE 50 MG PO TABS: ORAL_TABLET | ORAL | 3 refills | Status: DC

## 2022-10-01 MED ORDER — METOPROLOL SUCCINATE ER 25 MG PO TB24: 25.0000 mg | ORAL_TABLET | Freq: Every day | ORAL | 3 refills | Status: DC

## 2022-10-01 NOTE — Telephone Encounter (Signed)
Pt's medications were sent to pt's pharmacy as requested. Confirmation received.  

## 2022-10-01 NOTE — Telephone Encounter (Signed)
*  STAT* If patient is at the pharmacy, call can be transferred to refill team.   1. Which medications need to be refilled? (please list name of each medication and dose if known) flecainide (TAMBOCOR) 50 MG tablet   metoprolol succinate (TOPROL-XL) 25 MG 24 hr tablet   2. Which pharmacy/location (including street and city if local pharmacy) is medication to be sent to? [id number] - [place], [address]   3. Do they need a 30 day or 90 day supply? [place]

## 2022-10-04 DIAGNOSIS — S32020D Wedge compression fracture of second lumbar vertebra, subsequent encounter for fracture with routine healing: Secondary | ICD-10-CM | POA: Diagnosis not present

## 2022-10-04 DIAGNOSIS — M533 Sacrococcygeal disorders, not elsewhere classified: Secondary | ICD-10-CM | POA: Diagnosis not present

## 2022-10-04 DIAGNOSIS — R531 Weakness: Secondary | ICD-10-CM | POA: Diagnosis not present

## 2022-10-07 DIAGNOSIS — M533 Sacrococcygeal disorders, not elsewhere classified: Secondary | ICD-10-CM | POA: Diagnosis not present

## 2022-10-07 DIAGNOSIS — R531 Weakness: Secondary | ICD-10-CM | POA: Diagnosis not present

## 2022-10-07 DIAGNOSIS — S32020D Wedge compression fracture of second lumbar vertebra, subsequent encounter for fracture with routine healing: Secondary | ICD-10-CM | POA: Diagnosis not present

## 2022-10-10 DIAGNOSIS — M533 Sacrococcygeal disorders, not elsewhere classified: Secondary | ICD-10-CM | POA: Diagnosis not present

## 2022-10-10 DIAGNOSIS — R531 Weakness: Secondary | ICD-10-CM | POA: Diagnosis not present

## 2022-10-10 DIAGNOSIS — S32020D Wedge compression fracture of second lumbar vertebra, subsequent encounter for fracture with routine healing: Secondary | ICD-10-CM | POA: Diagnosis not present

## 2022-10-11 DIAGNOSIS — M810 Age-related osteoporosis without current pathological fracture: Secondary | ICD-10-CM | POA: Diagnosis not present

## 2022-10-15 DIAGNOSIS — R531 Weakness: Secondary | ICD-10-CM | POA: Diagnosis not present

## 2022-10-15 DIAGNOSIS — S32020D Wedge compression fracture of second lumbar vertebra, subsequent encounter for fracture with routine healing: Secondary | ICD-10-CM | POA: Diagnosis not present

## 2022-10-15 DIAGNOSIS — M533 Sacrococcygeal disorders, not elsewhere classified: Secondary | ICD-10-CM | POA: Diagnosis not present

## 2022-10-18 DIAGNOSIS — R531 Weakness: Secondary | ICD-10-CM | POA: Diagnosis not present

## 2022-10-18 DIAGNOSIS — M810 Age-related osteoporosis without current pathological fracture: Secondary | ICD-10-CM | POA: Diagnosis not present

## 2022-10-18 DIAGNOSIS — S32020D Wedge compression fracture of second lumbar vertebra, subsequent encounter for fracture with routine healing: Secondary | ICD-10-CM | POA: Diagnosis not present

## 2022-10-18 DIAGNOSIS — M533 Sacrococcygeal disorders, not elsewhere classified: Secondary | ICD-10-CM | POA: Diagnosis not present

## 2022-10-21 DIAGNOSIS — M7062 Trochanteric bursitis, left hip: Secondary | ICD-10-CM | POA: Diagnosis not present

## 2022-10-22 DIAGNOSIS — M533 Sacrococcygeal disorders, not elsewhere classified: Secondary | ICD-10-CM | POA: Diagnosis not present

## 2022-10-22 DIAGNOSIS — S32020D Wedge compression fracture of second lumbar vertebra, subsequent encounter for fracture with routine healing: Secondary | ICD-10-CM | POA: Diagnosis not present

## 2022-10-22 DIAGNOSIS — R531 Weakness: Secondary | ICD-10-CM | POA: Diagnosis not present

## 2022-10-25 DIAGNOSIS — M810 Age-related osteoporosis without current pathological fracture: Secondary | ICD-10-CM | POA: Diagnosis not present

## 2022-10-31 ENCOUNTER — Encounter (HOSPITAL_BASED_OUTPATIENT_CLINIC_OR_DEPARTMENT_OTHER): Payer: Self-pay | Admitting: Emergency Medicine

## 2022-10-31 ENCOUNTER — Emergency Department (HOSPITAL_BASED_OUTPATIENT_CLINIC_OR_DEPARTMENT_OTHER)
Admission: EM | Admit: 2022-10-31 | Discharge: 2022-10-31 | Disposition: A | Discharge status: Home / Self Care | Payer: Medicare PPO | Attending: Emergency Medicine | Admitting: Emergency Medicine

## 2022-10-31 ENCOUNTER — Other Ambulatory Visit: Payer: Self-pay

## 2022-10-31 ENCOUNTER — Emergency Department (HOSPITAL_BASED_OUTPATIENT_CLINIC_OR_DEPARTMENT_OTHER): Payer: Medicare PPO

## 2022-10-31 DIAGNOSIS — Z79899 Other long term (current) drug therapy: Secondary | ICD-10-CM | POA: Insufficient documentation

## 2022-10-31 DIAGNOSIS — N3000 Acute cystitis without hematuria: Secondary | ICD-10-CM | POA: Insufficient documentation

## 2022-10-31 DIAGNOSIS — D72829 Elevated white blood cell count, unspecified: Secondary | ICD-10-CM | POA: Diagnosis not present

## 2022-10-31 DIAGNOSIS — D582 Other hemoglobinopathies: Secondary | ICD-10-CM | POA: Diagnosis not present

## 2022-10-31 DIAGNOSIS — R1084 Generalized abdominal pain: Secondary | ICD-10-CM | POA: Diagnosis present

## 2022-10-31 DIAGNOSIS — I10 Essential (primary) hypertension: Secondary | ICD-10-CM | POA: Insufficient documentation

## 2022-10-31 DIAGNOSIS — N200 Calculus of kidney: Secondary | ICD-10-CM | POA: Diagnosis not present

## 2022-10-31 LAB — URINALYSIS, MICROSCOPIC (REFLEX): RBC / HPF: NONE SEEN RBC/hpf (ref 0–5)

## 2022-10-31 LAB — CBC
HCT: 43.8 % (ref 36.0–46.0)
Hemoglobin: 15.2 g/dL — ABNORMAL HIGH (ref 12.0–15.0)
MCH: 31.9 pg (ref 26.0–34.0)
MCHC: 34.7 g/dL (ref 30.0–36.0)
MCV: 91.8 fL (ref 80.0–100.0)
Platelets: 270 10*3/uL (ref 150–400)
RBC: 4.77 MIL/uL (ref 3.87–5.11)
RDW: 12.9 % (ref 11.5–15.5)
WBC: 16.2 10*3/uL — ABNORMAL HIGH (ref 4.0–10.5)
nRBC: 0 % (ref 0.0–0.2)

## 2022-10-31 LAB — COMPREHENSIVE METABOLIC PANEL
ALT: 42 U/L (ref 0–44)
AST: 50 U/L — ABNORMAL HIGH (ref 15–41)
Albumin: 3.8 g/dL (ref 3.5–5.0)
Alkaline Phosphatase: 75 U/L (ref 38–126)
Anion gap: 8 (ref 5–15)
BUN: 20 mg/dL (ref 8–23)
CO2: 26 mmol/L (ref 22–32)
Calcium: 8.3 mg/dL — ABNORMAL LOW (ref 8.9–10.3)
Chloride: 98 mmol/L (ref 98–111)
Creatinine, Ser: 0.74 mg/dL (ref 0.44–1.00)
GFR, Estimated: >60 mL/min (ref >60)
Glucose, Bld: 122 mg/dL — ABNORMAL HIGH (ref 70–99)
Potassium: 4.5 mmol/L (ref 3.5–5.1)
Sodium: 132 mmol/L — ABNORMAL LOW (ref 135–145)
Total Bilirubin: 1 mg/dL (ref 0.3–1.2)
Total Protein: 7 g/dL (ref 6.5–8.1)

## 2022-10-31 LAB — URINALYSIS, ROUTINE W REFLEX MICROSCOPIC
Bilirubin Urine: NEGATIVE
Glucose, UA: NEGATIVE mg/dL
Hgb urine dipstick: NEGATIVE
Ketones, ur: NEGATIVE mg/dL
Leukocytes,Ua: NEGATIVE
Nitrite: POSITIVE — AB
Protein, ur: NEGATIVE mg/dL
Specific Gravity, Urine: 1.025 (ref 1.005–1.030)
pH: 7 (ref 5.0–8.0)

## 2022-10-31 MED ORDER — KETOROLAC TROMETHAMINE 30 MG/ML IJ SOLN
30.0000 mg | Freq: Once | INTRAMUSCULAR | Status: AC
  Administered 2022-10-31: 30 mg via INTRAVENOUS
  Filled 2022-10-31: qty 1

## 2022-10-31 MED ORDER — DICYCLOMINE HCL 10 MG/ML IM SOLN
20.0000 mg | Freq: Once | INTRAMUSCULAR | Status: AC
  Administered 2022-10-31: 20 mg via INTRAMUSCULAR
  Filled 2022-10-31: qty 2

## 2022-10-31 MED ORDER — SULFAMETHOXAZOLE-TRIMETHOPRIM 800-160 MG PO TABS
1.0000 | ORAL_TABLET | Freq: Once | ORAL | Status: AC
  Administered 2022-10-31: 1 via ORAL
  Filled 2022-10-31: qty 1

## 2022-10-31 MED ORDER — ONDANSETRON HCL 4 MG/2ML IJ SOLN
4.0000 mg | Freq: Once | INTRAMUSCULAR | Status: AC
  Administered 2022-10-31: 4 mg via INTRAVENOUS
  Filled 2022-10-31: qty 2

## 2022-10-31 MED ORDER — SODIUM CHLORIDE 0.9 % IV BOLUS
500.0000 mL | Freq: Once | INTRAVENOUS | Status: AC
  Administered 2022-10-31: 500 mL via INTRAVENOUS

## 2022-10-31 MED ORDER — SULFAMETHOXAZOLE-TRIMETHOPRIM 800-160 MG PO TABS: 1.0000 | ORAL_TABLET | Freq: Two times a day (BID) | ORAL | 0 refills | Status: AC

## 2022-10-31 MED ORDER — SODIUM CHLORIDE 0.9 % IV SOLN: INTRAVENOUS | Status: DC

## 2022-10-31 NOTE — ED Triage Notes (Signed)
Lower abdominal pain started this afternoon. Last BM "a few days ago" hx of chronic constipation and diverticulitis. Denies n/v, fevers, urinary sx.

## 2022-10-31 NOTE — ED Provider Notes (Signed)
[facility] Provider Note   CSN: [id number] Arrival date & time: 10/31/22  0050     History  Chief Complaint  Patient presents with   Abdominal Pain    Gwendolyn Alvarado is a 77 y.o. female.  The history is provided by the patient.  Abdominal Pain Pain location:  Generalized Pain radiates to:  Does not radiate Pain severity:  Moderate Onset quality:  Gradual Timing:  Constant Progression:  Unchanged Chronicity:  New Context: not alcohol use   Relieved by:  Nothing Worsened by:  Nothing Ineffective treatments:  None tried Associated symptoms: constipation   Associated symptoms: no diarrhea, no dysuria, no fever and no vomiting   Risk factors: being elderly   Patient with IBS has been constipated for several days and has taken an enema and mag citrate without relief of pain or constipation.  No emesis.      Past Medical History:  Diagnosis Date   Angioma cavernosum    Atrial fibrillation [facility])    Colon polyps    Diverticulitis    Diverticulosis    HTN (hypertension)    Hypotension 06/23/2016   Irritable bowel syndrome (IBS)    Seizure disorder ([place])      Home Medications Prior to Admission medications   Medication Sig Start Date End Date Taking? Authorizing Provider  sulfamethoxazole-trimethoprim (BACTRIM DS) 800-160 MG tablet Take 1 tablet by mouth 2 (two) times daily for 7 days. 10/31/22 11/07/22 Yes Kyren Knick, MD  acetaminophen (TYLENOL) 325 MG tablet Take 650 mg by mouth every 6 (six) hours as needed.    [provider]  betamethasone valerate ointment (VALISONE) 0.1 % Apply a pea sized amount topically BID for up to 2 weeks as needed. 08/16/22   Salvadore Dom, MD  DEXILANT 60 MG capsule Take 1 capsule (60 mg total) by mouth daily. 01/24/22   Irene Shipper, MD  diclofenac sodium (VOLTAREN) 1 % GEL Apply 2-4 g topically 3 (three) times daily as needed. 11/03/18   [provider]  dicyclomine  (BENTYL) 10 MG capsule Take 1-2 capsules every 4-6 hours as needed 01/24/22   Irene Shipper, MD  flecainide (TAMBOCOR) 50 MG tablet TAKE 1 TABLET(50 MG) BY MOUTH TWICE DAILY. 10/01/22   Evans Lance, MD  Lacosamide 150 MG TABS Take 1 tablet by mouth 2 (two) times daily.    [provider]  metoprolol succinate (TOPROL-XL) 25 MG 24 hr tablet Take 1 tablet (25 mg total) by mouth daily. 10/01/22   Evans Lance, MD      Allergies    Carbamazepine, Dilantin [phenytoin sodium extended], Doxycycline, Hydrocodone-acetaminophen, Phenytoin, Phenytoin sodium extended, Other, Penicillins, Codeine, Hydrocodone, Phenytoin sodium, and Hydrocodone-acetaminophen    Review of Systems   Review of Systems  Constitutional:  Negative for fever.  HENT:  Negative for facial swelling.   Gastrointestinal:  Positive for abdominal pain and constipation. Negative for diarrhea and vomiting.  Genitourinary:  Negative for dysuria.  All other systems reviewed and are negative.   Physical Exam Updated Vital Signs BP (!) 145/67   Pulse (!) 59   Temp (!) 97.5 F (36.4 C) (Oral)   Resp 18   Ht 5\' 4"  (1.626 m)   Wt 71.7 kg   SpO2 95%   BMI 27.12 kg/m  Physical Exam Vitals and nursing note reviewed.  Constitutional:      General: She is not in acute distress.    Appearance: She is  well-developed.  HENT:     Head: Normocephalic and atraumatic.     Nose: Nose normal.  Eyes:     Pupils: Pupils are equal, round, and reactive to light.  Cardiovascular:     Rate and Rhythm: Normal rate and regular rhythm.     Pulses: Normal pulses.     Heart sounds: Normal heart sounds.  Pulmonary:     Effort: Pulmonary effort is normal. No respiratory distress.     Breath sounds: Normal breath sounds.  Abdominal:     General: Bowel sounds are normal. There is no distension.     Palpations: Abdomen is soft.     Tenderness: There is no abdominal tenderness. There is no guarding or rebound.  Genitourinary:     Vagina: No vaginal discharge.  Musculoskeletal:        General: Normal range of motion.     Cervical back: Normal range of motion and neck supple.  Skin:    General: Skin is dry.     Capillary Refill: Capillary refill takes less than 2 seconds.     Findings: No erythema or rash.  Neurological:     General: No focal deficit present.     Mental Status: She is alert.     Deep Tendon Reflexes: Reflexes normal.  Psychiatric:        Mood and Affect: Mood normal.     ED Results / Procedures / Treatments   Labs (all labs ordered are listed, but only abnormal results are displayed) Results for orders placed or performed during the hospital encounter of 10/31/22  Comprehensive metabolic panel  Result Value Ref Range   Sodium 132 (L) 135 - 145 mmol/L   Potassium 4.5 3.5 - 5.1 mmol/L   Chloride 98 98 - 111 mmol/L   CO2 26 22 - 32 mmol/L   Glucose, Bld 122 (H) 70 - 99 mg/dL   BUN 20 8 - 23 mg/dL   Creatinine, Ser 0.74 0.44 - 1.00 mg/dL   Calcium 8.3 (L) 8.9 - 10.3 mg/dL   Total Protein 7.0 6.5 - 8.1 g/dL   Albumin 3.8 3.5 - 5.0 g/dL   AST 50 (H) 15 - 41 U/L   ALT 42 0 - 44 U/L   Alkaline Phosphatase 75 38 - 126 U/L   Total Bilirubin 1.0 0.3 - 1.2 mg/dL   GFR, Estimated >60 >60 mL/min   Anion gap 8 5 - 15  CBC  Result Value Ref Range   WBC 16.2 (H) 4.0 - 10.5 K/uL   RBC 4.77 3.87 - 5.11 MIL/uL   Hemoglobin 15.2 (H) 12.0 - 15.0 g/dL   HCT 43.8 36.0 - 46.0 %   MCV 91.8 80.0 - 100.0 fL   MCH 31.9 26.0 - 34.0 pg   MCHC 34.7 30.0 - 36.0 g/dL   RDW 12.9 11.5 - 15.5 %   Platelets 270 150 - 400 K/uL   nRBC 0.0 0.0 - 0.2 %  Urinalysis, Routine w reflex microscopic -Urine, Clean Catch  Result Value Ref Range   Color, Urine YELLOW YELLOW   APPearance CLEAR CLEAR   Specific Gravity, Urine 1.025 1.005 - 1.030   pH 7.0 5.0 - 8.0   Glucose, UA NEGATIVE NEGATIVE mg/dL   Hgb urine dipstick NEGATIVE NEGATIVE   Bilirubin Urine NEGATIVE NEGATIVE   Ketones, ur NEGATIVE NEGATIVE mg/dL    Protein, ur NEGATIVE NEGATIVE mg/dL   Nitrite POSITIVE (A) NEGATIVE   Leukocytes,Ua NEGATIVE NEGATIVE  Urinalysis, Microscopic (reflex)  Result Value  Ref Range   RBC / HPF NONE SEEN 0 - 5 RBC/hpf   WBC, UA 0-5 0 - 5 WBC/hpf   Bacteria, UA MANY (A) NONE SEEN   Squamous Epithelial / HPF 0-5 0 - 5 /HPF   Mucus PRESENT    CT Renal Stone Study  Result Date: 10/31/2022 CLINICAL DATA:  Flank pain EXAM: CT ABDOMEN AND PELVIS WITHOUT CONTRAST TECHNIQUE: Multidetector CT imaging of the abdomen and pelvis was performed following the standard protocol without IV contrast. RADIATION DOSE REDUCTION: This exam was performed according to the departmental dose-optimization program which includes automated exposure control, adjustment of the mA and/or kV according to patient size and/or use of iterative reconstruction technique. COMPARISON:  03/26/2022 FINDINGS: Lower Chest: Calcified granuloma in the right lower lobe. Hepatobiliary: Normal hepatic contours. No intra- or extrahepatic biliary dilatation. Status post cholecystectomy. Pancreas: Normal pancreas. No ductal dilatation or peripancreatic fluid collection. Spleen: Normal. Adrenals/Urinary Tract: The adrenal glands are normal. 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 Nonobstructing left renal calculi measuring up to 5 mm are unchanged. No hydroureteronephrosis. The urinary bladder is normal for degree of distention Stomach/Bowel: There is no hiatal hernia. Normal duodenal course and caliber. No small bowel dilatation or inflammation. Rectosigmoid diverticulosis without acute inflammation. Normal appendix. Vascular/Lymphatic: There is calcific atherosclerosis of the abdominal aorta. No lymphadenopathy. Reproductive: Status post hysterectomy. No adnexal mass. Other: None. Musculoskeletal: Chronic L2 compression fracture, unchanged compared to 07/06/2022. IMPRESSION: 1. No acute  abnormality of the abdomen or pelvis. 2. 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 3. Unchanged nonobstructing left renal calculi measuring up to 5 mm. Aortic atherosclerosis (ICD10-I70.0). Electronically Signed   By: Ulyses Jarred M.D.   On: 10/31/2022 02:15    EKG None  Radiology CT Renal Stone Study  Result Date: 10/31/2022 CLINICAL DATA:  Flank pain EXAM: CT ABDOMEN AND PELVIS WITHOUT CONTRAST TECHNIQUE: Multidetector CT imaging of the abdomen and pelvis was performed following the standard protocol without IV contrast. RADIATION DOSE REDUCTION: This exam was performed according to the departmental dose-optimization program which includes automated exposure control, adjustment of the mA and/or kV according to patient size and/or use of iterative reconstruction technique. COMPARISON:  03/26/2022 FINDINGS: Lower Chest: Calcified granuloma in the right lower lobe. Hepatobiliary: Normal hepatic contours. No intra- or extrahepatic biliary dilatation. Status post cholecystectomy. Pancreas: Normal pancreas. No ductal dilatation or peripancreatic fluid collection. Spleen: Normal. Adrenals/Urinary Tract: The adrenal glands are normal. 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 Nonobstructing left renal calculi measuring up to 5 mm are unchanged. No hydroureteronephrosis. The urinary bladder is normal for degree of distention Stomach/Bowel: There is no hiatal hernia. Normal duodenal course and caliber. No small bowel dilatation or inflammation. Rectosigmoid diverticulosis without acute inflammation. Normal appendix. Vascular/Lymphatic: There is calcific atherosclerosis of the abdominal aorta. No lymphadenopathy. Reproductive: Status post hysterectomy. No adnexal mass. Other: None. Musculoskeletal: Chronic L2 compression fracture, unchanged compared  to 07/06/2022. IMPRESSION: 1. No acute abnormality of the abdomen or pelvis. 2. 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 3. Unchanged nonobstructing left renal calculi measuring up to 5 mm. Aortic atherosclerosis (ICD10-I70.0). Electronically Signed   By: Ulyses Jarred M.D.   On: 10/31/2022 02:15    Procedures Procedures    Medications Ordered in ED Medications  0.9 %  sodium chloride infusion (0 mLs Intravenous Stopped 10/31/22 0306)  sodium chloride 0.9 % bolus 500 mL (0 mLs Intravenous Stopped 10/31/22 0236)  ondansetron (ZOFRAN) injection 4 mg (4 mg Intravenous Given 10/31/22 0203)  dicyclomine (BENTYL) injection 20 mg (20 mg Intramuscular Given 10/31/22 0205)  ketorolac (TORADOL) 30 MG/ML injection 30 mg (30 mg Intravenous Given 10/31/22 0300)  sulfamethoxazole-trimethoprim (BACTRIM DS) 800-160 MG per tablet 1 tablet (1 tablet Oral Given 10/31/22 0301)    ED Course/ Medical Decision Making/ A&P                             Medical Decision Making Patient with constipation and h/o diverticulitis presents with abdominal pain and constipation not responisive to mag citrate and enema   Amount and/or Complexity of Data Reviewed Independent Historian: spouse    Details: See above  External Data Reviewed: notes.    Details: Previous notes reviewed  Labs: ordered.    Details: All labs reviewed:  urine is consistent with UTI.  Sodium 132, normal potassium 4.5, normal creatinine .74, normal LFTs.  White count elevated 16.2, hemoglobin 15.2 elevated, platelets normal 270 Radiology: ordered and independent interpretation performed.    Details: No acute finding by me on CT  Risk Prescription drug management. Risk Details: Patient with constipation who took an enema and used mag citrate without appreciable output.  No vomiting no fevers, but lower abdominal pain.  Exam and vitals are benign and reassuring as is CT  scan.  Will start antibiotics for UTI.  Stable for discharge.  Strict return     Final Clinical Impression(s) / ED Diagnoses Final diagnoses:  Acute cystitis without hematuria   Return for intractable cough, coughing up blood, fevers > 100.4 unrelieved by medication, shortness of breath, intractable vomiting, chest pain, shortness of breath, weakness, numbness, changes in speech, facial asymmetry, abdominal pain, passing out, Inability to tolerate liquids or food, cough, altered mental status or any concerns. No signs of systemic illness or infection. The patient is nontoxic-appearing on exam and vital signs are within normal limits.  I have reviewed the triage vital signs and the nursing notes. Pertinent labs & imaging results that were available during my care of the patient were reviewed by me and considered in my medical decision making (see chart for details). After history, exam, and medical workup I feel the patient has been appropriately medically screened and is safe for discharge home. Pertinent diagnoses were discussed with the patient. Patient was given return precautions.  Rx / DC Orders ED Discharge Orders          Ordered    sulfamethoxazole-trimethoprim (BACTRIM DS) 800-160 MG tablet  2 times daily        10/31/22 0351              Jeannia Tatro, MD 10/31/22 0522

## 2022-11-02 LAB — URINE CULTURE
Culture: >=100000 — AB
Special Requests: NORMAL

## 2022-11-03 ENCOUNTER — Telehealth (HOSPITAL_BASED_OUTPATIENT_CLINIC_OR_DEPARTMENT_OTHER): Payer: Self-pay

## 2022-11-03 NOTE — Telephone Encounter (Signed)
Post ED Visit - Positive Culture Follow-up  Culture report reviewed by antimicrobial stewardship pharmacist: [place] Team []  Lorelei Pont, Pharm.D. []  Heide Guile, Pharm.D., BCPS AQ-ID []  Parks Neptune, Pharm.D., BCPS []  Alycia Rossetti, Pharm.D., BCPS [x]  [place], [place].D., BCPS, AAHIVP []  Legrand Como, Pharm.D., BCPS, AAHIVP []  Salome Arnt, PharmD, BCPS []  Johnnette Gourd, PharmD, BCPS []  Hughes Better, PharmD, BCPS []  Leeroy Cha, PharmD []  Laqueta Linden, PharmD, BCPS []  Albertina Parr, PharmD  [place] Team []  Leodis Sias, PharmD []  Lindell Spar, PharmD []  Royetta Asal, PharmD []  Graylin Shiver, Rph []  Rema Fendt) Glennon Mac, PharmD []  Arlyn Dunning, PharmD []  Netta Cedars, PharmD []  Dia Sitter, PharmD []  Leone Haven, PharmD []  Gretta Arab, PharmD []  Theodis Shove, PharmD []  Peggyann Juba, PharmD []  Reuel Boom, PharmD   Positive urine culture Treated with Sulfamethoxazole-Trimethoprim, organism sensitive to the same and no further patient follow-up is required at this time.  Glennon Hamilton 11/03/2022, 5:33 PM

## 2022-11-05 DIAGNOSIS — R110 Nausea: Secondary | ICD-10-CM | POA: Diagnosis not present

## 2022-11-05 DIAGNOSIS — N3000 Acute cystitis without hematuria: Secondary | ICD-10-CM | POA: Diagnosis not present

## 2022-11-05 DIAGNOSIS — K219 Gastro-esophageal reflux disease without esophagitis: Secondary | ICD-10-CM | POA: Diagnosis not present

## 2022-11-05 DIAGNOSIS — E860 Dehydration: Secondary | ICD-10-CM | POA: Diagnosis not present

## 2022-11-05 DIAGNOSIS — K580 Irritable bowel syndrome with diarrhea: Secondary | ICD-10-CM | POA: Diagnosis not present

## 2022-11-11 DIAGNOSIS — R531 Weakness: Secondary | ICD-10-CM | POA: Diagnosis not present

## 2022-11-11 DIAGNOSIS — M533 Sacrococcygeal disorders, not elsewhere classified: Secondary | ICD-10-CM | POA: Diagnosis not present

## 2022-11-11 DIAGNOSIS — S32020D Wedge compression fracture of second lumbar vertebra, subsequent encounter for fracture with routine healing: Secondary | ICD-10-CM | POA: Diagnosis not present

## 2022-11-18 ENCOUNTER — Encounter: Payer: Self-pay | Admitting: *Deleted

## 2022-11-19 ENCOUNTER — Other Ambulatory Visit: Payer: Self-pay

## 2022-11-19 ENCOUNTER — Telehealth: Payer: Self-pay | Admitting: Internal Medicine

## 2022-11-19 MED ORDER — CIPROFLOXACIN HCL 500 MG PO TABS: 500.0000 mg | ORAL_TABLET | Freq: Two times a day (BID) | ORAL | 0 refills | Status: DC

## 2022-11-19 NOTE — Telephone Encounter (Signed)
Spoke with pt and she is aware of Dr. Lamar Sprinkles recommendations. Script sent to pharmacy. She knows to go to the ER if she worsens.

## 2022-11-19 NOTE — Telephone Encounter (Signed)
Patient called stating she is having some issues and complication regarding the medication prescribed for her diverticulitis. Would like to go into more detail when she has her phone call returned. Please advise.

## 2022-11-19 NOTE — Telephone Encounter (Signed)
Pt states she has had diverticulitis several times and Dr. Marina Goodell always gives her Cipro. She had some on hand but they expired in 2021. Reports she is having discomfort at the bottom of her stomach like she has had in the past and just in general feels bad. She has no fever and the symptoms started yesterday. Pt is calling to see if she can get more cipro called in since the other script has expired. Please advise.

## 2022-11-19 NOTE — Telephone Encounter (Signed)
Linda, Please prescribe ciprofloxacin 500 mg p.o. twice daily x 10 days. Low residue diet, advance as tolerated Get her an appointment to see one of the advanced practitioners within the next 3 to 7 days. If she gets worse in the interim, go to the ER FYI, her daughter is an internal medicine physician here in [place] with [employer]. Thanks, Dr. Marina Goodell

## 2022-12-16 DIAGNOSIS — M545 Low back pain, unspecified: Secondary | ICD-10-CM | POA: Diagnosis not present

## 2022-12-17 DIAGNOSIS — S76012D Strain of muscle, fascia and tendon of left hip, subsequent encounter: Secondary | ICD-10-CM | POA: Diagnosis not present

## 2022-12-19 DIAGNOSIS — N8110 Cystocele, unspecified: Secondary | ICD-10-CM | POA: Diagnosis not present

## 2022-12-19 DIAGNOSIS — N815 Vaginal enterocele: Secondary | ICD-10-CM | POA: Diagnosis not present

## 2022-12-24 DIAGNOSIS — S76012D Strain of muscle, fascia and tendon of left hip, subsequent encounter: Secondary | ICD-10-CM | POA: Diagnosis not present

## 2022-12-26 DIAGNOSIS — S76012D Strain of muscle, fascia and tendon of left hip, subsequent encounter: Secondary | ICD-10-CM | POA: Diagnosis not present

## 2023-01-06 DIAGNOSIS — S76012D Strain of muscle, fascia and tendon of left hip, subsequent encounter: Secondary | ICD-10-CM | POA: Diagnosis not present

## 2023-01-08 DIAGNOSIS — S76012D Strain of muscle, fascia and tendon of left hip, subsequent encounter: Secondary | ICD-10-CM | POA: Diagnosis not present

## 2023-01-10 DIAGNOSIS — M545 Low back pain, unspecified: Secondary | ICD-10-CM | POA: Diagnosis not present

## 2023-01-20 DIAGNOSIS — S76012D Strain of muscle, fascia and tendon of left hip, subsequent encounter: Secondary | ICD-10-CM | POA: Diagnosis not present

## 2023-01-22 ENCOUNTER — Telehealth: Payer: Self-pay | Admitting: Internal Medicine

## 2023-01-22 DIAGNOSIS — S76012D Strain of muscle, fascia and tendon of left hip, subsequent encounter: Secondary | ICD-10-CM | POA: Diagnosis not present

## 2023-01-22 NOTE — Telephone Encounter (Signed)
PT will be leaving in a few days to go on a trip for 2.5 weeks and wants to see if she can have a RX sent for her stomach issues. She is on dicyclomine but once she eats about 30 minutes later she experiences extreme stomach pain with constipation followed by diarrhea that lasts a while. Requesting call back.

## 2023-01-23 ENCOUNTER — Other Ambulatory Visit: Payer: Self-pay | Admitting: *Deleted

## 2023-01-23 MED ORDER — HYOSCYAMINE SULFATE ER 0.375 MG PO TB12: 0.3750 mg | ORAL_TABLET | Freq: Two times a day (BID) | ORAL | 2 refills | Status: DC | PRN

## 2023-01-23 NOTE — Telephone Encounter (Signed)
Patient called and notified of Dr. Lamar Sprinkles instructions. Informed patient to stop Dicyclomine, start Citrucel 1-2 tablespoons daily, and new Levid .375mg  po daily as needed. Patient also informed she has 2 refills. Patient understood and agreed.

## 2023-01-23 NOTE — Telephone Encounter (Signed)
Patient states she is having stomach pain after she eats and with the constipation, immediately has diarrhea 30 minutes after. Patient also states the Dicyclomine is not working and wants to know if there is anything else she can take? Please advise.

## 2023-01-23 NOTE — Telephone Encounter (Signed)
1.  Stop dicyclomine 2.  Start Citrucel 1 to 2 tablespoons daily.  This may keep the bowels more consistent 3.  Prescribe Levbid 0.375 mg p.o. twice daily as needed; #60; 2 refills 4.  Wish her an enjoyable trip Thanks Dr. Marina Goodell

## 2023-01-27 ENCOUNTER — Other Ambulatory Visit (HOSPITAL_COMMUNITY): Payer: Self-pay

## 2023-01-27 DIAGNOSIS — S76012D Strain of muscle, fascia and tendon of left hip, subsequent encounter: Secondary | ICD-10-CM | POA: Diagnosis not present

## 2023-01-28 DIAGNOSIS — M7062 Trochanteric bursitis, left hip: Secondary | ICD-10-CM | POA: Diagnosis not present

## 2023-02-04 ENCOUNTER — Other Ambulatory Visit (HOSPITAL_COMMUNITY): Payer: Self-pay

## 2023-02-12 ENCOUNTER — Other Ambulatory Visit (HOSPITAL_COMMUNITY): Payer: Self-pay

## 2023-03-05 DIAGNOSIS — R569 Unspecified convulsions: Secondary | ICD-10-CM | POA: Diagnosis not present

## 2023-03-13 DIAGNOSIS — L821 Other seborrheic keratosis: Secondary | ICD-10-CM | POA: Diagnosis not present

## 2023-03-13 DIAGNOSIS — L570 Actinic keratosis: Secondary | ICD-10-CM | POA: Diagnosis not present

## 2023-03-13 DIAGNOSIS — B351 Tinea unguium: Secondary | ICD-10-CM | POA: Diagnosis not present

## 2023-03-13 DIAGNOSIS — L720 Epidermal cyst: Secondary | ICD-10-CM | POA: Diagnosis not present

## 2023-03-13 DIAGNOSIS — L110 Acquired keratosis follicularis: Secondary | ICD-10-CM | POA: Diagnosis not present

## 2023-03-13 DIAGNOSIS — D485 Neoplasm of uncertain behavior of skin: Secondary | ICD-10-CM | POA: Diagnosis not present

## 2023-04-23 ENCOUNTER — Other Ambulatory Visit: Payer: Self-pay | Admitting: Family Medicine

## 2023-04-23 DIAGNOSIS — Z23 Encounter for immunization: Secondary | ICD-10-CM | POA: Diagnosis not present

## 2023-04-23 DIAGNOSIS — Z1231 Encounter for screening mammogram for malignant neoplasm of breast: Secondary | ICD-10-CM

## 2023-05-20 ENCOUNTER — Ambulatory Visit: Payer: Medicare PPO

## 2023-05-22 DIAGNOSIS — L292 Pruritus vulvae: Secondary | ICD-10-CM | POA: Diagnosis not present

## 2023-05-22 DIAGNOSIS — L249 Irritant contact dermatitis, unspecified cause: Secondary | ICD-10-CM | POA: Diagnosis not present

## 2023-05-27 ENCOUNTER — Ambulatory Visit
Admission: RE | Admit: 2023-05-27 | Discharge: 2023-05-27 | Disposition: A | Discharge status: Home / Self Care | Payer: Medicare PPO | Source: Ambulatory Visit | Attending: Family Medicine | Admitting: Family Medicine

## 2023-05-27 DIAGNOSIS — Z1231 Encounter for screening mammogram for malignant neoplasm of breast: Secondary | ICD-10-CM

## 2023-06-05 DIAGNOSIS — M13831 Other specified arthritis, right wrist: Secondary | ICD-10-CM | POA: Diagnosis not present

## 2023-06-05 DIAGNOSIS — M79645 Pain in left finger(s): Secondary | ICD-10-CM | POA: Diagnosis not present

## 2023-06-05 DIAGNOSIS — M1711 Unilateral primary osteoarthritis, right knee: Secondary | ICD-10-CM | POA: Diagnosis not present

## 2023-06-05 DIAGNOSIS — M65312 Trigger thumb, left thumb: Secondary | ICD-10-CM | POA: Diagnosis not present

## 2023-06-05 DIAGNOSIS — M25561 Pain in right knee: Secondary | ICD-10-CM | POA: Diagnosis not present

## 2023-06-06 DIAGNOSIS — M65312 Trigger thumb, left thumb: Secondary | ICD-10-CM | POA: Insufficient documentation

## 2023-06-11 DIAGNOSIS — M1711 Unilateral primary osteoarthritis, right knee: Secondary | ICD-10-CM | POA: Diagnosis not present

## 2023-06-13 DIAGNOSIS — M1711 Unilateral primary osteoarthritis, right knee: Secondary | ICD-10-CM | POA: Diagnosis not present

## 2023-07-22 DIAGNOSIS — M25661 Stiffness of right knee, not elsewhere classified: Secondary | ICD-10-CM | POA: Diagnosis not present

## 2023-07-22 DIAGNOSIS — M1711 Unilateral primary osteoarthritis, right knee: Secondary | ICD-10-CM | POA: Diagnosis not present

## 2023-07-22 DIAGNOSIS — R531 Weakness: Secondary | ICD-10-CM | POA: Diagnosis not present

## 2023-08-11 DIAGNOSIS — R531 Weakness: Secondary | ICD-10-CM | POA: Diagnosis not present

## 2023-08-11 DIAGNOSIS — M1711 Unilateral primary osteoarthritis, right knee: Secondary | ICD-10-CM | POA: Diagnosis not present

## 2023-08-11 DIAGNOSIS — M25661 Stiffness of right knee, not elsewhere classified: Secondary | ICD-10-CM | POA: Diagnosis not present

## 2023-08-13 DIAGNOSIS — M25661 Stiffness of right knee, not elsewhere classified: Secondary | ICD-10-CM | POA: Diagnosis not present

## 2023-08-13 DIAGNOSIS — R531 Weakness: Secondary | ICD-10-CM | POA: Diagnosis not present

## 2023-08-13 DIAGNOSIS — M1711 Unilateral primary osteoarthritis, right knee: Secondary | ICD-10-CM | POA: Diagnosis not present

## 2023-08-18 DIAGNOSIS — M1711 Unilateral primary osteoarthritis, right knee: Secondary | ICD-10-CM | POA: Diagnosis not present

## 2023-08-18 DIAGNOSIS — R531 Weakness: Secondary | ICD-10-CM | POA: Diagnosis not present

## 2023-08-18 DIAGNOSIS — M25661 Stiffness of right knee, not elsewhere classified: Secondary | ICD-10-CM | POA: Diagnosis not present

## 2023-08-20 ENCOUNTER — Ambulatory Visit: Payer: Medicare PPO | Attending: Internal Medicine | Admitting: Internal Medicine

## 2023-08-20 VITALS — BP 128/74 | HR 73 | Resp 16 | Ht 64.0 in | Wt 164.4 lb

## 2023-08-20 DIAGNOSIS — M1711 Unilateral primary osteoarthritis, right knee: Secondary | ICD-10-CM | POA: Diagnosis not present

## 2023-08-20 DIAGNOSIS — M25661 Stiffness of right knee, not elsewhere classified: Secondary | ICD-10-CM | POA: Diagnosis not present

## 2023-08-20 DIAGNOSIS — I480 Paroxysmal atrial fibrillation: Secondary | ICD-10-CM

## 2023-08-20 DIAGNOSIS — R531 Weakness: Secondary | ICD-10-CM | POA: Diagnosis not present

## 2023-08-20 NOTE — Progress Notes (Signed)
 HPI [name] returns today for follow-up. She is a 78 year old woman with paroxysmal atrial fibrillation who has been nicely controlled with flecainide  and beta blocker therapy.  She is not on systemic anticoagulation for her atrial fibrillation with her h/o cavernous hemangioma. She denies chest pain or shortness of breath. She has had no symptomatic atrial fibrillation on medical therapy.  No recurrent falls. Allergies  Allergen Reactions   Carbamazepine Hives and Other (See Comments)    Blisters similar to Rogue Clear Syndrome  Other Reaction(s): Other (See Comments), Stevens-Johnson Syndrome, stevens-johnson syndrome   Dilantin [Phenytoin Sodium Extended] Hives and Other (See Comments)    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 Pinion Johnston's Syndrome Blisters   Doxycycline Nausea And Vomiting, Other (See Comments) and Nausea Only    Made her sick for about 6 weeks & lost 30 lbs  Other Reaction(s): GI Intolerance  Made her sick for about 6 weeks & lost 30 lbs  Other reaction(s): Nausea, Nausea And Vomiting, Other (See Comments)  Made her sick for about 6 weeks & lost 30 lbs  Made her sick for about 6 weeks & lost 30 lbs  Made her sick for about 6 weeks & lost 30 lbs, Other reaction(s): Nausea, Nausea And Vomiting, Other (See Comments), Made her sick for about 6 weeks & lost 30 lbs, Made her sick for about 6 weeks & lost 30 lbs   Hydrocodone-Acetaminophen  Nausea And Vomiting and Other (See Comments)    Other Reaction(s): GI Intolerance  Other reaction(s): Nausea And Vomiting, Other (See Comments)   Phenytoin Hives    Other Reaction(s): stevens-johnson syndrome   Phenytoin Sodium Extended Hives and Other (See Comments)    Other reaction(s): Hives  blisters  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 Pinion Johnston's Syndrome  Other Reaction(s): Other (See Comments)  Other reaction(s): Hives  blisters    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 Pinion Johnston's Syndrome    Other reaction(s): Hives, Other (See  Comments), Stevens-Johnson Syndrome  Other reaction(s): Hives  blisters    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 Pinion Johnston's Syndrome  Other reaction(s): Hives, blisters, , Blisters similar to Apple Computer Syndrome, , Other reaction(s): Hives, Other (See Comments), Stevens-Johnson Syndrome, Other reaction(s): Hives, blisters, , Blisters similar to Apple [employer] Syndrome   Other Rash   Penicillins Itching and Rash    Other reaction(s): Itching  Other reaction(s): Itching  Other reaction(s): Itching, Rash  Other reaction(s): Itching  Other reaction(s): Itching, Other reaction(s): Itching, Rash, Other reaction(s): Itching   Codeine Nausea And Vomiting    Other Reaction(s): GI Intolerance   Hydrocodone     Other Reaction(s): Not available, Other (See Comments)   Phenytoin Sodium    Hydrocodone-Acetaminophen  Nausea And Vomiting     Current Outpatient Medications  Medication Sig Dispense Refill   acetaminophen  (TYLENOL ) 325 MG tablet Take 650 mg by mouth every 6 (six) hours as needed.     betamethasone  valerate ointment (VALISONE ) 0.1 % Apply a pea sized amount topically BID for up to 2 weeks as needed. 30 g 0   ciprofloxacin  (CIPRO ) 500 MG tablet Take 1 tablet (500 mg total) by mouth 2 (two) times daily. 20 tablet 0   DEXILANT  60 MG capsule Take 1 capsule (60 mg total) by mouth daily. 90 capsule 3   diclofenac sodium (VOLTAREN) 1 % GEL Apply 2-4 g topically 3 (three) times daily as needed.     flecainide  (TAMBOCOR ) 50 MG tablet TAKE 1 TABLET(50 MG) BY MOUTH TWICE DAILY. 180 tablet 3   hyoscyamine  (LEVBID)  0.375 MG 12 hr tablet Take 1 tablet (0.375 mg total) by mouth every 12 (twelve) hours as needed. 60 tablet 2   Lacosamide  150 MG TABS Take 1 tablet by mouth 2 (two) times daily.     metoprolol  succinate (TOPROL -XL) 25 MG 24 hr tablet Take 1 tablet (25 mg total) by mouth daily. 90 tablet 3   No current facility-administered medications for this visit.     Past Medical History:   Diagnosis Date   Angioma cavernosum    Atrial fibrillation (HCC)    Colon polyps    Diverticulitis    Diverticulosis    HTN (hypertension)    Hypotension 06/23/2016   Irritable bowel syndrome (IBS)    Seizure disorder (HCC)     ROS:   All systems reviewed and negative except as noted in the HPI.   Past Surgical History:  Procedure Laterality Date   ABDOMINAL HYSTERECTOMY     Bartholins gland cyst resection     BREAST EXCISIONAL BIOPSY Right    benign   CERVICAL DISC SURGERY     C2   CHOLECYSTECTOMY     SALIVARY GLAND SURGERY       Family History  Problem Relation Age of Onset   Stroke Mother    Lung cancer Mother    Breast cancer Paternal Aunt 16 - 80   Seizures Neg Hx      Social History   Socioeconomic History   Marital status: Married    Spouse name: Not on file   Number of children: 3   Years of education: Not on file   Highest education level: Not on file  Occupational History   Occupation: retired  Tobacco Use   Smoking status: Never   Smokeless tobacco: Never  Vaping Use   Vaping status: Never Used  Substance and Sexual Activity   Alcohol use: No   Drug use: No   Sexual activity: Yes  Other Topics Concern   Not on file  Social History Narrative   Not on file   Social Drivers of Health   Financial Resource Strain: Not on file  Food Insecurity: Not on file  Transportation Needs: Not on file  Physical Activity: Not on file  Stress: Not on file  Social Connections: Not on file  Intimate Partner Violence: Not on file     BP 128/74 (BP Location: Left Arm, Patient Position: Sitting, Cuff Size: Large)   Pulse 73   Resp 16   Ht 5\' 4"  (1.626 m)   Wt 164 lb 6.4 oz (74.6 kg)   SpO2 93%   BMI 28.22 kg/m   Physical Exam:  Well appearing NAD HEENT: Unremarkable Neck:  No JVD, no thyromegally Lymphatics:  No adenopathy Back:  No CVA tenderness Lungs:  Clear with no wheezes HEART:  Regular rate rhythm, no murmurs, no rubs, no  clicks Abd:  soft, positive bowel sounds, no organomegally, no rebound, no guarding Ext:  2 plus pulses, no edema, no cyanosis, no clubbing Skin:  No rashes no nodules Neuro:  CN II through XII intact, motor grossly intact  EKG - nsr    Assess/Plan:  PAF - she will continue flecainide  and   Cavernous hemangioma - she is asymptomatic but will not undergo systemic anti-coagulation.    Pete Brand Johnye Kist,MD

## 2023-08-21 DIAGNOSIS — L900 Lichen sclerosus et atrophicus: Secondary | ICD-10-CM | POA: Diagnosis not present

## 2023-08-21 DIAGNOSIS — N3281 Overactive bladder: Secondary | ICD-10-CM | POA: Diagnosis not present

## 2023-08-21 DIAGNOSIS — M65312 Trigger thumb, left thumb: Secondary | ICD-10-CM | POA: Diagnosis not present

## 2023-08-25 ENCOUNTER — Telehealth: Payer: Self-pay | Admitting: Internal Medicine

## 2023-08-25 DIAGNOSIS — M1711 Unilateral primary osteoarthritis, right knee: Secondary | ICD-10-CM | POA: Diagnosis not present

## 2023-08-25 DIAGNOSIS — R531 Weakness: Secondary | ICD-10-CM | POA: Diagnosis not present

## 2023-08-25 DIAGNOSIS — M25661 Stiffness of right knee, not elsewhere classified: Secondary | ICD-10-CM | POA: Diagnosis not present

## 2023-08-25 DIAGNOSIS — I10 Essential (primary) hypertension: Secondary | ICD-10-CM

## 2023-08-25 MED ORDER — FLECAINIDE ACETATE 50 MG PO TABS: ORAL_TABLET | ORAL | 3 refills | Status: AC

## 2023-08-25 MED ORDER — METOPROLOL SUCCINATE ER 25 MG PO TB24
25.0000 mg | ORAL_TABLET | Freq: Every day | ORAL | 3 refills | Status: DC
Start: 2023-08-25 — End: 2023-10-24

## 2023-08-25 NOTE — Telephone Encounter (Signed)
*  STAT* If patient is at the pharmacy, call can be transferred to refill team.   1. Which medications need to be refilled? (please list name of each medication and dose if known)   flecainide (TAMBOCOR) 50 MG tablet  metoprolol succinate (TOPROL-XL) 25 MG 24 hr tablet   2. Would you like to learn more about the convenience, safety, & potential cost savings by using the [facility]?   3. Are you open to using the [facility] (Type [facility]. ).  4. Which pharmacy/location (including street and city if local pharmacy) is medication to be sent to?  [id number] - [place], [place] - [address]   5. Do they need a 30 day or 90 day supply?   90 days  Patient stated she still has some of these medications.

## 2023-08-25 NOTE — Telephone Encounter (Signed)
Pt's medications were sent to pt's pharmacy as requested. Confirmation received.  

## 2023-09-03 DIAGNOSIS — M7062 Trochanteric bursitis, left hip: Secondary | ICD-10-CM | POA: Diagnosis not present

## 2023-09-03 DIAGNOSIS — M160 Bilateral primary osteoarthritis of hip: Secondary | ICD-10-CM | POA: Diagnosis not present

## 2023-09-03 DIAGNOSIS — M7061 Trochanteric bursitis, right hip: Secondary | ICD-10-CM | POA: Diagnosis not present

## 2023-09-05 DIAGNOSIS — E559 Vitamin D deficiency, unspecified: Secondary | ICD-10-CM | POA: Diagnosis not present

## 2023-09-05 DIAGNOSIS — Z0000 Encounter for general adult medical examination without abnormal findings: Secondary | ICD-10-CM | POA: Diagnosis not present

## 2023-09-05 DIAGNOSIS — K219 Gastro-esophageal reflux disease without esophagitis: Secondary | ICD-10-CM | POA: Diagnosis not present

## 2023-09-05 DIAGNOSIS — I4891 Unspecified atrial fibrillation: Secondary | ICD-10-CM | POA: Diagnosis not present

## 2023-09-05 DIAGNOSIS — G4700 Insomnia, unspecified: Secondary | ICD-10-CM | POA: Diagnosis not present

## 2023-09-05 DIAGNOSIS — R030 Elevated blood-pressure reading, without diagnosis of hypertension: Secondary | ICD-10-CM | POA: Diagnosis not present

## 2023-09-05 DIAGNOSIS — G40109 Localization-related (focal) (partial) symptomatic epilepsy and epileptic syndromes with simple partial seizures, not intractable, without status epilepticus: Secondary | ICD-10-CM | POA: Diagnosis not present

## 2023-09-05 DIAGNOSIS — Z8719 Personal history of other diseases of the digestive system: Secondary | ICD-10-CM | POA: Diagnosis not present

## 2023-09-05 DIAGNOSIS — M810 Age-related osteoporosis without current pathological fracture: Secondary | ICD-10-CM | POA: Diagnosis not present

## 2023-10-24 ENCOUNTER — Other Ambulatory Visit: Payer: Self-pay

## 2023-10-24 DIAGNOSIS — I10 Essential (primary) hypertension: Secondary | ICD-10-CM

## 2023-10-24 MED ORDER — METOPROLOL SUCCINATE ER 25 MG PO TB24: 25.0000 mg | ORAL_TABLET | Freq: Every day | ORAL | 3 refills | Status: DC

## 2023-11-27 DIAGNOSIS — L718 Other rosacea: Secondary | ICD-10-CM | POA: Diagnosis not present

## 2023-11-27 DIAGNOSIS — L239 Allergic contact dermatitis, unspecified cause: Secondary | ICD-10-CM | POA: Diagnosis not present

## 2023-11-27 DIAGNOSIS — L820 Inflamed seborrheic keratosis: Secondary | ICD-10-CM | POA: Diagnosis not present

## 2023-12-08 ENCOUNTER — Telehealth: Payer: Self-pay | Admitting: Internal Medicine

## 2023-12-08 NOTE — Telephone Encounter (Signed)
 Inbound call from patient, states she is going to [place] In June, and would like medication for her diverticulitis. Patient states she would like to know if she has to schedule an appointment or if she can get medication called in.

## 2023-12-17 DIAGNOSIS — N9089 Other specified noninflammatory disorders of vulva and perineum: Secondary | ICD-10-CM | POA: Diagnosis not present

## 2023-12-17 DIAGNOSIS — B372 Candidiasis of skin and nail: Secondary | ICD-10-CM | POA: Diagnosis not present

## 2023-12-17 DIAGNOSIS — L820 Inflamed seborrheic keratosis: Secondary | ICD-10-CM | POA: Diagnosis not present

## 2023-12-17 DIAGNOSIS — N3281 Overactive bladder: Secondary | ICD-10-CM | POA: Diagnosis not present

## 2023-12-22 ENCOUNTER — Other Ambulatory Visit: Payer: Self-pay

## 2023-12-22 MED ORDER — CIPROFLOXACIN HCL 500 MG PO TABS: 500.0000 mg | ORAL_TABLET | Freq: Two times a day (BID) | ORAL | 0 refills | Status: DC

## 2023-12-22 MED ORDER — DICYCLOMINE HCL 10 MG PO CAPS: 10.0000 mg | ORAL_CAPSULE | Freq: Three times a day (TID) | ORAL | 0 refills | Status: DC

## 2023-12-22 NOTE — Telephone Encounter (Signed)
 Pt aware, script sent in for cipro . Pt also requested refill on dicyclomine . Refill also sent to pharmacy.

## 2023-12-24 DIAGNOSIS — M7061 Trochanteric bursitis, right hip: Secondary | ICD-10-CM | POA: Diagnosis not present

## 2023-12-24 DIAGNOSIS — M1711 Unilateral primary osteoarthritis, right knee: Secondary | ICD-10-CM | POA: Diagnosis not present

## 2024-03-02 DIAGNOSIS — G40109 Localization-related (focal) (partial) symptomatic epilepsy and epileptic syndromes with simple partial seizures, not intractable, without status epilepticus: Secondary | ICD-10-CM | POA: Diagnosis not present

## 2024-03-03 DIAGNOSIS — H52203 Unspecified astigmatism, bilateral: Secondary | ICD-10-CM | POA: Diagnosis not present

## 2024-03-03 DIAGNOSIS — Z961 Presence of intraocular lens: Secondary | ICD-10-CM | POA: Diagnosis not present

## 2024-04-13 ENCOUNTER — Ambulatory Visit: Admitting: Podiatry

## 2024-04-13 ENCOUNTER — Encounter: Payer: Self-pay | Admitting: Podiatry

## 2024-04-13 DIAGNOSIS — L603 Nail dystrophy: Secondary | ICD-10-CM

## 2024-04-13 DIAGNOSIS — L608 Other nail disorders: Secondary | ICD-10-CM | POA: Diagnosis not present

## 2024-04-13 DIAGNOSIS — H6501 Acute serous otitis media, right ear: Secondary | ICD-10-CM | POA: Diagnosis not present

## 2024-04-13 NOTE — Progress Notes (Signed)
 Subjective:  Patient ID: Gwendolyn Alvarado, female    DOB: Jan 01, 1946,  MRN: [id number] HPI Chief Complaint  Patient presents with   Nail Problem    2nd toenail bilateral - thick nails, curved into skin, left is tender with pressure against the tip of the toe   New Patient (Initial Visit)    78 y.o. female presents with the above complaint.   ROS: Denies fever chills nausea vomiting muscle aches pains calf pain back pain chest pain shortness of breath.  Past Medical History:  Diagnosis Date   Angioma cavernosum    Atrial fibrillation (HCC)    Colon polyps    Diverticulitis    Diverticulosis    HTN (hypertension)    Hypotension 06/23/2016   Irritable bowel syndrome (IBS)    Seizure disorder (HCC)    Past Surgical History:  Procedure Laterality Date   ABDOMINAL HYSTERECTOMY     Bartholins gland cyst resection     BREAST EXCISIONAL BIOPSY Right    benign   CERVICAL DISC SURGERY     C2   CHOLECYSTECTOMY     SALIVARY GLAND SURGERY      Current Outpatient Medications:    Lacosamide  (VIMPAT ) 150 MG TABS, Take by mouth., Disp: , Rfl:    loratadine (CLARITIN) 10 MG tablet, Take 10 mg by mouth daily., Disp: , Rfl:    flecainide  (TAMBOCOR ) 50 MG tablet, TAKE 1 TABLET(50 MG) BY MOUTH TWICE DAILY., Disp: 180 tablet, Rfl: 3   metoprolol  succinate (TOPROL -XL) 25 MG 24 hr tablet, Take 1 tablet (25 mg total) by mouth daily., Disp: 90 tablet, Rfl: 3  Allergies  Allergen Reactions   Carbamazepine Hives and Other (See Comments)    Blisters similar to Elspeth Louder Syndrome  Other Reaction(s): Other (See Comments), Stevens-Johnson Syndrome, stevens-johnson syndrome   Dilantin [Phenytoin Sodium Extended] Hives and Other (See Comments)    Blisters similar to Elspeth Johnston's Syndrome Blisters   Doxycycline Nausea And Vomiting, Other (See Comments) and Nausea Only    Made her sick for about 6 weeks & lost 30 lbs  Other Reaction(s): GI Intolerance  Made her sick for about 6 weeks &  lost 30 lbs  Other reaction(s): Nausea, Nausea And Vomiting, Other (See Comments)  Made her sick for about 6 weeks & lost 30 lbs  Made her sick for about 6 weeks & lost 30 lbs  Made her sick for about 6 weeks & lost 30 lbs, Other reaction(s): Nausea, Nausea And Vomiting, Other (See Comments), Made her sick for about 6 weeks & lost 30 lbs, Made her sick for about 6 weeks & lost 30 lbs   Hydrocodone-Acetaminophen  Nausea And Vomiting and Other (See Comments)    Other Reaction(s): GI Intolerance  Other reaction(s): Nausea And Vomiting, Other (See Comments)   Phenytoin Hives    Other Reaction(s): stevens-johnson syndrome   Phenytoin Sodium Extended Hives and Other (See Comments)    Other reaction(s): Hives  blisters  Blisters similar to Elspeth Johnston's Syndrome  Other Reaction(s): Other (See Comments)  Other reaction(s): Hives  blisters    Blisters similar to Elspeth Johnston's Syndrome    Other reaction(s): Hives, Other (See Comments), Stevens-Johnson Syndrome  Other reaction(s): Hives  blisters    Blisters similar to [employer] Syndrome  Other reaction(s): Hives, blisters, , Blisters similar to [employer] Syndrome, , Other reaction(s): Hives, Other (See Comments), Stevens-Johnson Syndrome, Other reaction(s): Hives, blisters, , Blisters similar to [employer] Syndrome   Other Rash   Penicillins Itching  and Rash    Other reaction(s): Itching  Other reaction(s): Itching  Other reaction(s): Itching, Rash  Other reaction(s): Itching  Other reaction(s): Itching, Other reaction(s): Itching, Rash, Other reaction(s): Itching   Codeine Nausea And Vomiting    Other Reaction(s): GI Intolerance   Denosumab      Other Reaction(s): tired/nauseated for several weeks   Hydrocodone     Other Reaction(s): Not available, Other (See Comments)   Phenytoin Sodium    Hydrocodone-Acetaminophen  Nausea And Vomiting   Review of Systems Objective:  There were no vitals filed  for this visit.  General: Well developed, nourished, in no acute distress, alert and oriented x3   Dermatological: Skin is warm, dry and supple bilateral. Nails x 10 are well maintained; remaining integument appears unremarkable at this time. There are no open sores, no preulcerative lesions, no rash or signs of infection present.  Hammertoes second bilaterally resulting in small distal clavus and thickening of the toenails secondary to nail dystrophy.  Vascular: Dorsalis Pedis artery and Posterior Tibial artery pedal pulses are 2/4 bilateral with immedate capillary fill time. Pedal hair growth present. No varicosities and no lower extremity edema present bilateral.   Neruologic: Grossly intact via light touch bilateral. Vibratory intact via tuning fork bilateral. Protective threshold with Semmes Wienstein monofilament intact to all pedal sites bilateral. Patellar and Achilles deep tendon reflexes 2+ bilateral. No Babinski or clonus noted bilateral.   Musculoskeletal: No gross boney pedal deformities bilateral. No pain, crepitus, or limitation noted with foot and ankle range of motion bilateral. Muscular strength 5/5 in all groups tested bilateral.  Gait: Unassisted, Nonantalgic.    Radiographs:  None taken  Assessment & Plan:   Assessment: Nail dystrophy second bilateral cannot rule out onychomycosis.  Hammertoe deformities bilateral.  Plan: Samples in skin and nail were taken today to be safe pathologic evaluation.  Follow-up with her in 1 month to discuss findings.     Tomoki Lucken T. [place], [place]

## 2024-04-27 ENCOUNTER — Ambulatory Visit: Payer: Self-pay | Admitting: Podiatry

## 2024-04-28 DIAGNOSIS — Z23 Encounter for immunization: Secondary | ICD-10-CM | POA: Diagnosis not present

## 2024-05-05 DIAGNOSIS — L718 Other rosacea: Secondary | ICD-10-CM | POA: Diagnosis not present

## 2024-05-25 ENCOUNTER — Ambulatory Visit: Admitting: Podiatry

## 2024-06-15 DIAGNOSIS — N200 Calculus of kidney: Secondary | ICD-10-CM | POA: Diagnosis not present

## 2024-06-16 ENCOUNTER — Telehealth: Payer: Self-pay

## 2024-06-16 NOTE — Telephone Encounter (Signed)
 Pt states she has had IBS for a long time but for the past 3 weeks her stomach has been hurting and she goes back and forth with diarrhea/constipation. She has been taking bentyl  but thinks it may be causing some constipation. Having a lot of cramping and abd discomfort. Pt scheduled to see Alan Coombs PA tomorrow at 8:40am. Pt aware of appt.

## 2024-06-16 NOTE — Telephone Encounter (Signed)
-----   Message from Norleen Kiang sent at 06/16/2024  1:30 PM EST ----- Regarding: Appointment Rock, This is a patient of mine.  Her daughter is a development worker, community. She contacted the office with IBS type symptoms and wanted to be seen.  She was on hold for 31 minutes.  She happened to have my cell number and texted me. Please call her and get a bit more history.  Then, get her into see one of the advanced practitioners ASAP. Thanks, Dr.  Kiang

## 2024-06-16 NOTE — Telephone Encounter (Signed)
 Thank you Rock.  Nice work

## 2024-06-17 ENCOUNTER — Encounter: Payer: Self-pay | Admitting: Physician Assistant

## 2024-06-17 ENCOUNTER — Other Ambulatory Visit (INDEPENDENT_AMBULATORY_CARE_PROVIDER_SITE_OTHER)

## 2024-06-17 ENCOUNTER — Ambulatory Visit: Admitting: Physician Assistant

## 2024-06-17 ENCOUNTER — Ambulatory Visit (INDEPENDENT_AMBULATORY_CARE_PROVIDER_SITE_OTHER)
Admission: RE | Admit: 2024-06-17 | Discharge: 2024-06-17 | Disposition: A | Discharge status: Home / Self Care | Source: Ambulatory Visit | Attending: Physician Assistant | Admitting: Physician Assistant

## 2024-06-17 ENCOUNTER — Other Ambulatory Visit

## 2024-06-17 VITALS — BP 132/76 | HR 68 | Ht 62.0 in | Wt 163.1 lb

## 2024-06-17 DIAGNOSIS — K219 Gastro-esophageal reflux disease without esophagitis: Secondary | ICD-10-CM | POA: Diagnosis not present

## 2024-06-17 DIAGNOSIS — K5732 Diverticulitis of large intestine without perforation or abscess without bleeding: Secondary | ICD-10-CM

## 2024-06-17 DIAGNOSIS — K589 Irritable bowel syndrome without diarrhea: Secondary | ICD-10-CM

## 2024-06-17 DIAGNOSIS — I480 Paroxysmal atrial fibrillation: Secondary | ICD-10-CM

## 2024-06-17 DIAGNOSIS — N200 Calculus of kidney: Secondary | ICD-10-CM | POA: Diagnosis not present

## 2024-06-17 LAB — CBC WITH DIFFERENTIAL/PLATELET
Basophils Absolute: 0 K/uL (ref 0.0–0.1)
Basophils Relative: 0.7 % (ref 0.0–3.0)
Eosinophils Absolute: 0.2 K/uL (ref 0.0–0.7)
Eosinophils Relative: 2.2 % (ref 0.0–5.0)
HCT: 43.6 % (ref 36.0–46.0)
Hemoglobin: 15.1 g/dL — ABNORMAL HIGH (ref 12.0–15.0)
Lymphocytes Relative: 33.4 % (ref 12.0–46.0)
Lymphs Abs: 2.5 K/uL (ref 0.7–4.0)
MCHC: 34.7 g/dL (ref 30.0–36.0)
MCV: 91.8 fl (ref 78.0–100.0)
Monocytes Absolute: 0.7 K/uL (ref 0.1–1.0)
Monocytes Relative: 9.8 % (ref 3.0–12.0)
Neutro Abs: 4 K/uL (ref 1.4–7.7)
Neutrophils Relative %: 53.9 % (ref 43.0–77.0)
Platelets: 259 K/uL (ref 150.0–400.0)
RBC: 4.75 Mil/uL (ref 3.87–5.11)
RDW: 14 % (ref 11.5–15.5)
WBC: 7.5 K/uL (ref 4.0–10.5)

## 2024-06-17 LAB — BASIC METABOLIC PANEL WITH GFR
BUN: 11 mg/dL (ref 6–23)
CO2: 28 meq/L (ref 19–32)
Calcium: 9.2 mg/dL (ref 8.4–10.5)
Chloride: 101 meq/L (ref 96–112)
Creatinine, Ser: 0.74 mg/dL (ref 0.40–1.20)
GFR: 77.03 mL/min (ref >60.00)
Glucose, Bld: 83 mg/dL (ref 70–99)
Potassium: 4.5 meq/L (ref 3.5–5.1)
Sodium: 138 meq/L (ref 135–145)

## 2024-06-17 LAB — HEPATIC FUNCTION PANEL
ALT: 14 U/L (ref 0–35)
AST: 17 U/L (ref 0–37)
Albumin: 4.4 g/dL (ref 3.5–5.2)
Alkaline Phosphatase: 71 U/L (ref 39–117)
Bilirubin, Direct: 0.1 mg/dL (ref 0.0–0.3)
Total Bilirubin: 0.8 mg/dL (ref 0.2–1.2)
Total Protein: 7 g/dL (ref 6.0–8.3)

## 2024-06-17 LAB — TSH: TSH: 1.68 u[IU]/mL (ref 0.35–5.50)

## 2024-06-17 LAB — SEDIMENTATION RATE: Sed Rate: 8 mm/h (ref 0–30)

## 2024-06-17 MED ORDER — HYOSCYAMINE SULFATE 0.125 MG PO TABS: 0.1250 mg | ORAL_TABLET | Freq: Four times a day (QID) | ORAL | 0 refills | Status: DC | PRN

## 2024-06-17 NOTE — Progress Notes (Signed)
 06/17/2024 Gwendolyn Alvarado [id number] [birth year]  Referring provider: Seabron Lenis, MD Primary GI doctor: Dr. Abran  ASSESSMENT AND PLAN:  IBS mixed diarrhea predominant Status post cholecystectomy History of vaginal enterocele Flare x 4 weeks, AB cramping, hard stools then diarrhea, worse after lunch, can have a few days without BM On benefiber consistently, no new medications Denies fever, chills, hematochezia, mucus in stools Suspicious for pelvic floor dysfunction, IBS- mixed, rule out inflammation, etc -KUB to evaluate for stool burden/obstruction  -CRP/ESR to rule out inflammation, consider fecal calprotectin if elevated - TSH -Can do trial of IBGARD daily,  Levsin  as needed ( dicyclomine  not helping) -Add on citracel/benefiber, FODMAP, trial off lactulose and lifestyle changes discussed - possible pelvic floor component, consider referral to PT, information given -Consider SIBO testing or xifaxin trial pending results  History of recurrent diverticulitis May 2018 colonoscopy unremarkable other than diverticulosis 10/31/2022 CT renal stone study nonobstructing left renal calculi 5 mm no diverticulitis No AB pain, no fever, chills.  Will call if any symptoms. Add on fiber supplement, avoid NSAIDS, information given  GERD Status post cholecystectomy On Dexilant , controlled  Denies melena, dysphagia  Paroxysmal atrial fibrillation with hypertrophic cardiomyopathy 2015 echo EF 55 to 60%, grade 1 diastolic dysfunction mild mitral prolapse no aortic stenosis Not on a blood thinner No SOB, no CP  Patient Care Team: Gwendolyn Lenis, MD as PCP - General (Family Medicine) Gwendolyn Alvarado ORN, MD as PCP - Electrophysiology (Cardiology) Gwendolyn Kate Norris, MD as Consulting Physician (Obstetrics and Gynecology)  HISTORY OF PRESENT ILLNESS: 78 y.o. female with a past medical history listed below presents for evaluation of IBS.   Patient last seen in the office by Dr. Abran  01/24/2022 for IBS diarrhea predominant and recurrent diverticulitis.  Discussed the use of AI scribe software for clinical note transcription with the patient, who gave verbal consent to proceed.  History of Present Illness   Gwendolyn Alvarado is a 78 year old female with IBS and diverticulitis who presents with worsening abdominal symptoms.  Over the past three to four weeks, she has experienced a flare-up of her IBS symptoms, characterized by abdominal cramping followed by a need to use the bathroom. The pattern starts with constipation and progresses to diarrhea, occurring frequently after meals, particularly lunch, with symptoms beginning 15 to 30 minutes post-meal. She often needs to use the bathroom multiple times before the diarrhea resolves.  Bowel movements start with hard stools that require effort to pass, followed by several episodes of loose stools. This cycle can occur daily or intermittently, with some days having no bowel movements at all. She has been using dicyclomine , typically once a day before meals, but is uncertain of its effectiveness. She also uses Benafiber and stool softeners but is unsure of their impact.  No blood or mucus in stools, and no recent changes in medications or supplements, aside from the continued use of Dexilant  for heartburn, which is well-controlled as long as she does not miss a dose. The patient reports she does not really experience gas or bloating, and describes her abdominal pain as a general discomfort rather than localized pain.  Her past medical history includes a complete hysterectomy and a history of diverticulitis, which she distinguishes from her current symptoms. She also has a history of kidney stones and atrial fibrillation, for which she takes metoprolol  but is not on a blood thinner. No recent urinary tract infections, significant weight loss, or changes in urinary habits.  She  reports that she has never smoked. She has never used  smokeless tobacco. She reports that she does not drink alcohol and does not use drugs.  RELEVANT GI HISTORY, IMAGING AND LABS: Results          CBC    Component Value Date/Time   WBC 16.2 (H) 10/31/2022 0129   RBC 4.77 10/31/2022 0129   HGB 15.2 (H) 10/31/2022 0129   HCT 43.8 10/31/2022 0129   PLT 270 10/31/2022 0129   MCV 91.8 10/31/2022 0129   MCH 31.9 10/31/2022 0129   MCHC 34.7 10/31/2022 0129   RDW 12.9 10/31/2022 0129   LYMPHSABS 1.0 11/08/2020 1939   MONOABS 0.6 11/08/2020 1939   EOSABS 0.1 11/08/2020 1939   BASOSABS 0.0 11/08/2020 1939   No results for input(s): HGB in the last 8760 hours.  CMP     Component Value Date/Time   NA 132 (L) 10/31/2022 0129   K 4.5 10/31/2022 0129   CL 98 10/31/2022 0129   CO2 26 10/31/2022 0129   GLUCOSE 122 (H) 10/31/2022 0129   BUN 20 10/31/2022 0129   CREATININE 0.74 10/31/2022 0129   CALCIUM 8.3 (L) 10/31/2022 0129   PROT 7.0 10/31/2022 0129   ALBUMIN 3.8 10/31/2022 0129   AST 50 (H) 10/31/2022 0129   ALT 42 10/31/2022 0129   ALKPHOS 75 10/31/2022 0129   BILITOT 1.0 10/31/2022 0129   GFRNONAA >60 10/31/2022 0129   GFRAA >60 09/08/2018 1313      Latest Ref Rng & Units 10/31/2022    1:29 AM 03/26/2022    1:38 PM 11/08/2020    7:39 PM  Hepatic Function  Total Protein 6.5 - 8.1 g/dL 7.0  7.7  7.1   Albumin 3.5 - 5.0 g/dL 3.8  4.7  4.3   AST 15 - 41 U/L 50  28  15   ALT 0 - 44 U/L 42  29  15   Alk Phosphatase 38 - 126 U/L 75  85  71   Total Bilirubin 0.3 - 1.2 mg/dL 1.0  1.6  1.0       Current Medications:    Current Outpatient Medications (Cardiovascular):    flecainide  (TAMBOCOR ) 50 MG tablet, TAKE 1 TABLET(50 MG) BY MOUTH TWICE DAILY.   metoprolol  succinate (TOPROL -XL) 25 MG 24 hr tablet, Take 1 tablet (25 mg total) by mouth daily.  Current Outpatient Medications (Respiratory):    loratadine (CLARITIN) 10 MG tablet, Take 10 mg by mouth daily.    Current Outpatient Medications (Other):    hyoscyamine   (LEVSIN ) 0.125 MG tablet, Take 1 tablet (0.125 mg total) by mouth every 6 (six) hours as needed for cramping.   Lacosamide  (VIMPAT ) 150 MG TABS, Take by mouth.  Medical History:  Past Medical History:  Diagnosis Date   Angioma cavernosum    Atrial fibrillation (HCC)    Colon polyps    Diverticulitis    Diverticulosis    HTN (hypertension)    Hypotension 06/23/2016   Irritable bowel syndrome (IBS)    Seizure disorder (HCC)    Allergies:  Allergies  Allergen Reactions   Carbamazepine Hives and Other (See Comments)    Blisters similar to Elspeth Louder Syndrome  Other Reaction(s): Other (See Comments), Stevens-Johnson Syndrome, stevens-johnson syndrome   Dilantin [Phenytoin Sodium Extended] Hives and Other (See Comments)    Blisters similar to Elspeth Johnston's Syndrome Blisters   Doxycycline Nausea And Vomiting, Other (See Comments) and Nausea Only    Made her sick for  about 6 weeks & lost 30 lbs  Other Reaction(s): GI Intolerance  Made her sick for about 6 weeks & lost 30 lbs  Other reaction(s): Nausea, Nausea And Vomiting, Other (See Comments)  Made her sick for about 6 weeks & lost 30 lbs  Made her sick for about 6 weeks & lost 30 lbs  Made her sick for about 6 weeks & lost 30 lbs, Other reaction(s): Nausea, Nausea And Vomiting, Other (See Comments), Made her sick for about 6 weeks & lost 30 lbs, Made her sick for about 6 weeks & lost 30 lbs   Hydrocodone-Acetaminophen  Nausea And Vomiting and Other (See Comments)    Other Reaction(s): GI Intolerance  Other reaction(s): Nausea And Vomiting, Other (See Comments)   Phenytoin Hives    Other Reaction(s): stevens-johnson syndrome   Phenytoin Sodium Extended Hives and Other (See Comments)    Other reaction(s): Hives  blisters  Blisters similar to Elspeth Johnston's Syndrome  Other Reaction(s): Other (See Comments)  Other reaction(s): Hives  blisters    Blisters similar to Elspeth Johnston's Syndrome    Other  reaction(s): Hives, Other (See Comments), Stevens-Johnson Syndrome  Other reaction(s): Hives  blisters    Blisters similar to Elspeth Johnston's Syndrome  Other reaction(s): Hives, blisters, , Blisters similar to [employer] Syndrome, , Other reaction(s): Hives, Other (See Comments), Stevens-Johnson Syndrome, Other reaction(s): Hives, blisters, , Blisters similar to [employer] Syndrome   Other Rash   Penicillins Itching and Rash    Other reaction(s): Itching  Other reaction(s): Itching  Other reaction(s): Itching, Rash  Other reaction(s): Itching  Other reaction(s): Itching, Other reaction(s): Itching, Rash, Other reaction(s): Itching   Codeine Nausea And Vomiting    Other Reaction(s): GI Intolerance   Denosumab      Other Reaction(s): tired/nauseated for several weeks   Hydrocodone     Other Reaction(s): Not available, Other (See Comments)   Phenytoin Sodium    Hydrocodone-Acetaminophen  Nausea And Vomiting     Surgical History:  She  has a past surgical history that includes Salivary gland surgery; Bartholins gland cyst resection; Abdominal hysterectomy; Cholecystectomy; Breast excisional biopsy (Right); and Cervical disc surgery. Family History:  Her family history includes Breast cancer (age of onset: 38 - 72) in her paternal aunt; Lung cancer in her mother; Stroke in her mother.  REVIEW OF SYSTEMS  : All other systems reviewed and negative except where noted in the History of Present Illness.  PHYSICAL EXAM: BP 132/76   Pulse 68   Ht 5' 2 (1.575 m) Comment: masured without shoes  Wt 163 lb 2 oz (74 kg)   BMI 29.84 kg/m  Physical Exam   GENERAL APPEARANCE: Well nourished, in no apparent distress. HEENT: No cervical lymphadenopathy, unremarkable thyroid, sclerae anicteric, conjunctiva pink. RESPIRATORY: Respiratory effort normal, breath sounds equal bilaterally without rales, rhonchi, or wheezing. CARDIO: Regular rate and rhythm with no murmurs, rubs, or  gallops, peripheral pulses intact. ABDOMEN: Soft, non-distended, active bowel sounds in all four quadrants, non-tender to palpation, no rebound, no mass appreciated. Hysterectomy scar palpable. RECTAL: Declines. MUSCULOSKELETAL: Full range of motion, normal gait, without edema. SKIN: Dry, intact without rashes or lesions. No jaundice. NEURO: Alert, oriented, no focal deficits. PSYCH: Cooperative, normal mood and affect.      Alan JONELLE Coombs, PA-C 9:54 AM

## 2024-06-17 NOTE — Progress Notes (Signed)
 Noted. Thanks for seeing Kyler

## 2024-06-17 NOTE — Patient Instructions (Addendum)
 Your provider has requested that you go to the basement level for lab work before leaving today. Press B on the elevator. The lab is located at the first door on the left as you exit the elevator.  Due to recent changes in healthcare laws, you may see the results of your imaging and laboratory studies on MyChart before your provider has had a chance to review them.  We understand that in some cases there may be results that are confusing or concerning to you. Not all laboratory results come back in the same time frame and the provider may be waiting for multiple results in order to interpret others.  Please give us  48 hours in order for your provider to thoroughly review all the results before contacting the office for clarification of your results.   Your provider has requested that you have an abdominal x ray before leaving today. Please go to the basement floor to our Radiology department for the test.  Miralax is an osmotic laxative.  It only brings more water into the stool.  This is safe to take daily.  Can take up to 17 gram of miralax twice a day.  Mix with juice or coffee.  Start 1 capful at night for 3-4 days and reassess your response in 3-4 days.  You can increase and decrease the dose based on your response.  Remember, it can take up to 3-4 days to take effect OR for the effects to wear off.   I often pair this with benefiber in the morning to help assure the stool is not too loose.   FIBER SUPPLEMENT You can do metamucil or fibercon once or twice a day but if this causes gas/bloating please switch to Benefiber or Citracel.  Fiber is good for constipation/diarrhea/irritable bowel syndrome.  It can also help with weight loss and can help lower your bad cholesterol (LDL).  Please do 1 TBSP in the morning in water, coffee, or tea.  It can take up to a month before you can see a difference with your bowel movements.  It is cheapest from [employer], [employer], [employer].   Toileting tips to  help with your constipation - Drink at least 64-80 ounces of water/liquid per day. - Establish a time to try to move your bowels every day.  For many people, this is after a cup of coffee or after a meal such as breakfast. - Sit all of the way back on the toilet keeping your back fairly straight and while sitting up, try to rest the tops of your forearms on your upper thighs.   - Raising your feet with a step stool/squatty potty can be helpful to improve the angle that allows your stool to pass through the rectum. - Relax the rectum feeling it bulge toward the toilet water.  If you feel your rectum raising toward your body, you are contracting rather than relaxing. - Breathe in and slowly exhale. Belly breath by expanding your belly towards your belly button. Keep belly expanded as you gently direct pressure down and back to the anus.  A low pitched GRRR sound can assist with increasing intra-abdominal pressure.  (Can also trying to blow on a pinwheel and make it move, this helps with the same belly breathing) - Repeat 3-4 times. If unsuccessful, contract the pelvic floor to restore normal tone and get off the toilet.  Avoid excessive straining. - To reduce excessive wiping by teaching your anus to normally contract, place hands on outer aspect  of knees and resist knee movement outward.  Hold 5-10 second then place hands just inside of knees and resist inward movement of knees.  Hold 5 seconds.  Repeat a few times each way.  Go to the ER if unable to pass gas, severe AB pain, unable to hold down food, any shortness of breath of chest pain.   Thank you for trusting me with your gastrointestinal care!   Alan Coombs, PA-C   _______________________________________________________  If your blood pressure at your visit was 140/90 or greater, please contact your primary care physician to follow up on this.  _______________________________________________________  If you are age 75 or older,  your body mass index should be between 23-30. Your Body mass index is 29.84 kg/m. If this is out of the aforementioned range listed, please consider follow up with your Primary Care Provider.  If you are age 74 or younger, your body mass index should be between 19-25. Your Body mass index is 29.84 kg/m. If this is out of the aformentioned range listed, please consider follow up with your Primary Care Provider.   ________________________________________________________  The [facility] providers would like to encourage you to use MYCHART to communicate with providers for non-urgent requests or questions.  Due to long hold times on the telephone, sending your provider a message by [facility] may be a faster and more efficient way to get a response.  Please allow 48 business hours for a response.  Please remember that this is for non-urgent requests.  _______________________________________________________  [facility] is using a team-based approach to care.  Your team is made up of your doctor and two to three APPS. Our APPS (Nurse Practitioners and Physician Assistants) work with your physician to ensure care continuity for you. They are fully qualified to address your health concerns and develop a treatment plan. They communicate directly with your gastroenterologist to care for you. Seeing the Advanced Practice Practitioners on your physician's team can help you by facilitating care more promptly, often allowing for earlier appointments, access to diagnostic testing, procedures, and other specialty referrals.     Here some information about pelvic floor dysfunction. This may be contributing to some of your symptoms. We will continue with our evaluation but I do want you to consider adding on fiber supplement with low-dose MiraLAX daily. We could also refer to pelvic floor physical therapy.   Pelvic Floor Dysfunction, Female Pelvic floor dysfunction (PFD) is a condition that results  when the group of muscles and connective tissues that support the organs in the pelvis (pelvic floor muscles) do not work well. These muscles and their connections form a sling that supports the colon and bladder. In women, they also support the uterus. PFD causes pelvic floor muscles to be too weak, too tight, or both. In PFD, muscle movements are not coordinated. This may cause bowel or bladder problems. It may also cause pain. What are the causes? This condition may be caused by an injury to the pelvic area or by a weakening of pelvic muscles. This often results from pregnancy and childbirth or other types of strain. In many cases, the exact cause is not known. What increases the risk? The following factors may make you more likely to develop this condition: Having chronic bladder tissue inflammation (interstitial cystitis). Being an older person. Being overweight. History of radiation treatment for cancer in the pelvic region. Previous pelvic surgery, such as removal of the uterus (hysterectomy). What are the signs or symptoms? Symptoms of this condition  vary and may include: Bladder symptoms, such as: Trouble starting urination and emptying the bladder. Frequent urinary tract infections. Leaking urine when coughing, laughing, or exercising (stress incontinence). Having to pass urine urgently or frequently. Pain when passing urine. Bowel symptoms, such as: Constipation. Urgent or frequent bowel movements. Incomplete bowel movements. Painful bowel movements. Leaking stool or gas. Unexplained genital or rectal pain. Genital or rectal muscle spasms. Low back pain. Other symptoms may include: A heavy, full, or aching feeling in the vagina. A bulge that protrudes into the vagina. Pain during or after sex. How is this diagnosed? This condition may be diagnosed based on: Your symptoms and medical history. A physical exam. During the exam, your health care provider may check your  pelvic muscles for tightness, spasm, pain, or weakness. This may include a rectal exam and a pelvic exam. In some cases, you may have diagnostic tests, such as: Electrical muscle function tests. Urine flow testing. X-ray tests of bowel function. Ultrasound of the pelvic organs. How is this treated? Treatment for this condition depends on the symptoms. Treatment options include: Physical therapy. This may include Kegel exercises to help relax or strengthen the pelvic floor muscles. Biofeedback. This type of therapy provides feedback on how tight your pelvic floor muscles are so that you can learn to control them. Internal or external massage therapy. A treatment that involves electrical stimulation of the pelvic floor muscles to help control pain (transcutaneous electrical nerve stimulation, or TENS). Sound wave therapy (ultrasound) to reduce muscle spasms. Medicines, such as: Muscle relaxants. Bladder control medicines. Surgery to reconstruct or support pelvic floor muscles may be an option if other treatments do not help. Follow these instructions at home: Activity Do your usual activities as told by your health care provider. Ask your health care provider if you should modify any activities. Do pelvic floor strengthening or relaxing exercises at home as told by your physical therapist. Lifestyle Maintain a healthy weight. Eat foods that are high in fiber, such as beans, whole grains, and fresh fruits and vegetables. Limit foods that are high in fat and processed sugars, such as fried or sweet foods. Manage stress with relaxation techniques such as yoga or meditation. General instructions If you have problems with leakage: Use absorbable pads or wear padded underwear. Wash frequently with mild soap. Keep your genital and anal area as clean and dry as possible. Ask your health care provider if you should try a barrier cream to prevent skin irritation. Take warm baths to relieve pelvic  muscle tension or spasms. Take over-the-counter and prescription medicines only as told by your health care provider. Keep all follow-up visits. How is this prevented? The cause of PFD is not always known, but there are a few things you can do to reduce the risk of developing this condition, including: Staying at a healthy weight. Getting regular exercise. Managing stress. Contact a health care provider if: Your symptoms are not improving with home care. You have signs or symptoms of PFD that get worse at home. You develop new signs or symptoms. You have signs of a urinary tract infection, such as: Fever. Chills. Increased urinary frequency. A burning feeling when urinating. You have not had a bowel movement in 3 days (constipation). Summary Pelvic floor dysfunction results when the muscles and connective tissues in your pelvic floor do not work well. These muscles and their connections form a sling that supports your colon and bladder. In women, they also support the uterus. PFD may be caused  by an injury to the pelvic area or by a weakening of pelvic muscles. PFD causes pelvic floor muscles to be too weak, too tight, or a combination of both. Symptoms may vary from person to person. In most cases, PFD can be treated with physical therapies and medicines. Surgery may be an option if other treatments do not help. This information is not intended to replace advice given to you by your health care provider. Make sure you discuss any questions you have with your health care provider. Document Revised: 11/29/2020 Document Reviewed: 11/29/2020 Elsevier Patient Education  2022 [employer]  Small intestinal bacterial overgrowth (SIBO) occurs when there is an abnormal increase in the overall bacterial population in the small intestine -- particularly types of bacteria not commonly found in that part of the digestive tract. Small intestinal bacterial overgrowth (SIBO) commonly results when a  circumstance -- such as surgery or disease -- slows the passage of food and waste products in the digestive tract, creating a breeding ground for bacteria.  Signs and symptoms of SIBO often include: Loss of appetite Abdominal pain Nausea Bloating An uncomfortable feeling of fullness after eating Diarrhea or constipation, depending on the type of gas produced  What foods trigger SIBO? While foods aren't the original cause of SIBO, certain foods do encourage the overgrowth of the wrong bacteria in your small intestine. If you're feeding them their favorite foods, they're going to grow more, and that will trigger more of your SIBO symptoms. By the same token, you can help reduce the overgrowth by starving the problematic bacteria of their favorite foods. This strategy has led to a number of proposed SIBO eating plans. The plans vary, and so do individual results. But in general, they tend to recommend limiting carbohydrates.  These include: Sugars and sweeteners. Fruits and starchy vegetables. Dairy products. Grains.  There is a test for this we can do called a breath test, if you are positive we will treat you with an antibiotic to see if it helps.  Your symptoms are very suspicious for this condition, as discussed, we will start you on an antibiotic to see if this helps.

## 2024-06-18 ENCOUNTER — Ambulatory Visit: Payer: Self-pay | Admitting: Physician Assistant

## 2024-06-18 MED ORDER — DEXLANSOPRAZOLE 60 MG PO CPDR: 60.0000 mg | DELAYED_RELEASE_CAPSULE | Freq: Every day | ORAL | 3 refills | Status: AC

## 2024-06-20 ENCOUNTER — Emergency Department (HOSPITAL_BASED_OUTPATIENT_CLINIC_OR_DEPARTMENT_OTHER)

## 2024-06-20 ENCOUNTER — Emergency Department (HOSPITAL_BASED_OUTPATIENT_CLINIC_OR_DEPARTMENT_OTHER)
Admission: EM | Admit: 2024-06-20 | Discharge: 2024-06-20 | Disposition: A | Discharge status: Home / Self Care | Attending: Emergency Medicine | Admitting: Emergency Medicine

## 2024-06-20 ENCOUNTER — Other Ambulatory Visit: Payer: Self-pay

## 2024-06-20 ENCOUNTER — Encounter (HOSPITAL_BASED_OUTPATIENT_CLINIC_OR_DEPARTMENT_OTHER): Payer: Self-pay

## 2024-06-20 DIAGNOSIS — K449 Diaphragmatic hernia without obstruction or gangrene: Secondary | ICD-10-CM | POA: Diagnosis not present

## 2024-06-20 DIAGNOSIS — I10 Essential (primary) hypertension: Secondary | ICD-10-CM | POA: Insufficient documentation

## 2024-06-20 DIAGNOSIS — Z79899 Other long term (current) drug therapy: Secondary | ICD-10-CM | POA: Insufficient documentation

## 2024-06-20 DIAGNOSIS — R1084 Generalized abdominal pain: Secondary | ICD-10-CM | POA: Diagnosis present

## 2024-06-20 DIAGNOSIS — D1771 Benign lipomatous neoplasm of kidney: Secondary | ICD-10-CM | POA: Diagnosis not present

## 2024-06-20 DIAGNOSIS — N390 Urinary tract infection, site not specified: Secondary | ICD-10-CM | POA: Diagnosis not present

## 2024-06-20 DIAGNOSIS — K5730 Diverticulosis of large intestine without perforation or abscess without bleeding: Secondary | ICD-10-CM | POA: Diagnosis not present

## 2024-06-20 DIAGNOSIS — R109 Unspecified abdominal pain: Secondary | ICD-10-CM

## 2024-06-20 DIAGNOSIS — N200 Calculus of kidney: Secondary | ICD-10-CM | POA: Diagnosis not present

## 2024-06-20 LAB — LIPASE, BLOOD: Lipase: 35 U/L (ref 11–51)

## 2024-06-20 LAB — CBC WITH DIFFERENTIAL/PLATELET
Abs Immature Granulocytes: 0.04 K/uL (ref 0.00–0.07)
Basophils Absolute: 0.1 K/uL (ref 0.0–0.1)
Basophils Relative: 1 %
Eosinophils Absolute: 0.2 K/uL (ref 0.0–0.5)
Eosinophils Relative: 2 %
HCT: 42.7 % (ref 36.0–46.0)
Hemoglobin: 14.8 g/dL (ref 12.0–15.0)
Immature Granulocytes: 1 %
Lymphocytes Relative: 30 %
Lymphs Abs: 2.2 K/uL (ref 0.7–4.0)
MCH: 31.5 pg (ref 26.0–34.0)
MCHC: 34.7 g/dL (ref 30.0–36.0)
MCV: 90.9 fL (ref 80.0–100.0)
Monocytes Absolute: 0.8 K/uL (ref 0.1–1.0)
Monocytes Relative: 11 %
Neutro Abs: 4.1 K/uL (ref 1.7–7.7)
Neutrophils Relative %: 55 %
Platelets: 264 K/uL (ref 150–400)
RBC: 4.7 MIL/uL (ref 3.87–5.11)
RDW: 13.1 % (ref 11.5–15.5)
WBC: 7.4 K/uL (ref 4.0–10.5)
nRBC: 0 % (ref 0.0–0.2)

## 2024-06-20 LAB — URINALYSIS, ROUTINE W REFLEX MICROSCOPIC
Bilirubin Urine: NEGATIVE
Glucose, UA: NEGATIVE mg/dL
Hgb urine dipstick: NEGATIVE
Ketones, ur: NEGATIVE mg/dL
Nitrite: POSITIVE — AB
Protein, ur: NEGATIVE mg/dL
Specific Gravity, Urine: 1.01 (ref 1.005–1.030)
pH: 7 (ref 5.0–8.0)

## 2024-06-20 LAB — COMPREHENSIVE METABOLIC PANEL WITH GFR
ALT: 12 U/L (ref 0–44)
AST: 19 U/L (ref 15–41)
Albumin: 3.9 g/dL (ref 3.5–5.0)
Alkaline Phosphatase: 73 U/L (ref 38–126)
Anion gap: 16 — ABNORMAL HIGH (ref 5–15)
BUN: 10 mg/dL (ref 8–23)
CO2: 17 mmol/L — ABNORMAL LOW (ref 22–32)
Calcium: 9.1 mg/dL (ref 8.9–10.3)
Chloride: 103 mmol/L (ref 98–111)
Creatinine, Ser: 0.7 mg/dL (ref 0.44–1.00)
GFR, Estimated: >60 mL/min (ref >60)
Glucose, Bld: 95 mg/dL (ref 70–99)
Potassium: 4.1 mmol/L (ref 3.5–5.1)
Sodium: 137 mmol/L (ref 135–145)
Total Bilirubin: 0.8 mg/dL (ref 0.0–1.2)
Total Protein: 6.6 g/dL (ref 6.5–8.1)

## 2024-06-20 LAB — URINALYSIS, MICROSCOPIC (REFLEX)

## 2024-06-20 MED ORDER — FENTANYL CITRATE (PF) 50 MCG/ML IJ SOSY: 50.0000 ug | PREFILLED_SYRINGE | Freq: Once | INTRAMUSCULAR | Status: DC

## 2024-06-20 MED ORDER — SULFAMETHOXAZOLE-TRIMETHOPRIM 800-160 MG PO TABS: 1.0000 | ORAL_TABLET | Freq: Two times a day (BID) | ORAL | 0 refills | Status: AC

## 2024-06-20 MED ORDER — SULFAMETHOXAZOLE-TRIMETHOPRIM 800-160 MG PO TABS
1.0000 | ORAL_TABLET | Freq: Once | ORAL | Status: AC
  Administered 2024-06-20: 1 via ORAL
  Filled 2024-06-20: qty 1

## 2024-06-20 MED ORDER — IOHEXOL 300 MG/ML  SOLN
80.0000 mL | Freq: Once | INTRAMUSCULAR | Status: AC | PRN
  Administered 2024-06-20: 80 mL via INTRAVENOUS

## 2024-06-20 NOTE — ED Provider Notes (Addendum)
 [place] [facility] DEPARTMENT AT [facility] Provider Note   CSN: [id number] Arrival date & time: 06/20/24  9163     Patient presents with: Abdominal Pain   Gwendolyn Alvarado is a 78 y.o. female.   Patient here with abdominal pain since this morning.  No nausea vomiting diarrhea or urinary tract symptoms.  She has a history of hypertension IBS diverticulitis colon polyps A-fib.  She denies any chest pain shortness of breath fever chills.  Nothing makes it worse or better.  She has had abdominal hysterectomy and cholecystectomy in the past.  The history is provided by the patient.       Prior to Admission medications   Medication Sig Start Date End Date Taking? Authorizing Provider  sulfamethoxazole -trimethoprim  (BACTRIM  DS) 800-160 MG tablet Take 1 tablet by mouth 2 (two) times daily for 5 days. 06/20/24 06/25/24 Yes Trevar Boehringer, DO  dexlansoprazole  (DEXILANT ) 60 MG capsule Take 1 capsule (60 mg total) by mouth daily. 06/18/24   Craig Alan SAUNDERS, PA-C  flecainide  (TAMBOCOR ) 50 MG tablet TAKE 1 TABLET(50 MG) BY MOUTH TWICE DAILY. 08/25/23   Waddell Danelle ORN, MD  hyoscyamine  (LEVSIN ) 0.125 MG tablet Take 1 tablet (0.125 mg total) by mouth every 6 (six) hours as needed for cramping. 06/17/24   Craig Alan SAUNDERS, PA-C  Lacosamide  (VIMPAT ) 150 MG TABS Take by mouth.    [provider]  loratadine (CLARITIN) 10 MG tablet Take 10 mg by mouth daily.    [provider]  metoprolol  succinate (TOPROL -XL) 25 MG 24 hr tablet Take 1 tablet (25 mg total) by mouth daily. 10/24/23   Waddell Danelle ORN, MD    Allergies: Carbamazepine, Dilantin [phenytoin sodium extended], Doxycycline, Hydrocodone-acetaminophen , Phenytoin, Phenytoin sodium extended, Other, Penicillins, Codeine, Denosumab , Hydrocodone, Phenytoin sodium, and Hydrocodone-acetaminophen     Review of Systems  Updated Vital Signs BP (!) 145/127   Pulse 73   Temp 97.7 F (36.5 C) (Oral)   Resp 20   SpO2 94%    Physical Exam Vitals and nursing note reviewed.  Constitutional:      General: She is not in acute distress.    Appearance: She is well-developed. She is not ill-appearing.  HENT:     Head: Normocephalic and atraumatic.     Mouth/Throat:     Mouth: Mucous membranes are moist.  Eyes:     Extraocular Movements: Extraocular movements intact.     Conjunctiva/sclera: Conjunctivae normal.  Cardiovascular:     Rate and Rhythm: Normal rate and regular rhythm.     Heart sounds: Normal heart sounds. No murmur heard. Pulmonary:     Effort: Pulmonary effort is normal. No respiratory distress.     Breath sounds: Normal breath sounds.  Abdominal:     General: Abdomen is flat.     Palpations: Abdomen is soft.     Tenderness: There is generalized abdominal tenderness.  Musculoskeletal:        General: No swelling.     Cervical back: Neck supple.  Skin:    General: Skin is warm and dry.     Capillary Refill: Capillary refill takes less than 2 seconds.  Neurological:     Mental Status: She is alert.  Psychiatric:        Mood and Affect: Mood normal.     (all labs ordered are listed, but only abnormal results are displayed) Labs Reviewed  COMPREHENSIVE METABOLIC PANEL WITH GFR - Abnormal; Notable for the following components:      Result Value  CO2 17 (*)    Anion gap 16 (*)    All other components within normal limits  URINALYSIS, ROUTINE W REFLEX MICROSCOPIC - Abnormal; Notable for the following components:   Color, Urine STRAW (*)    Nitrite POSITIVE (*)    Leukocytes,Ua TRACE (*)    All other components within normal limits  URINALYSIS, MICROSCOPIC (REFLEX) - Abnormal; Notable for the following components:   Bacteria, UA MANY (*)    All other components within normal limits  URINE CULTURE  CBC WITH DIFFERENTIAL/PLATELET  LIPASE, BLOOD    EKG: None  Radiology: CT ABDOMEN PELVIS W CONTRAST Result Date: 06/20/2024 EXAM: CT ABDOMEN AND PELVIS WITH CONTRAST 06/20/2024  09:28:21 AM TECHNIQUE: CT of the abdomen and pelvis was performed with the administration of intravenous contrast. Multiplanar reformatted images are provided for review. Automated exposure control, iterative reconstruction, and/or weight-based adjustment of the mA/kV was utilized to reduce the radiation dose to as low as reasonably achievable. COMPARISON: 10/31/2022 CLINICAL HISTORY: Abdominal pain, acute, nonlocalized. FINDINGS: LOWER CHEST: No acute abnormality. LIVER: The liver is unremarkable. GALLBLADDER AND BILE DUCTS: Status post cholecystectomy. No biliary ductal dilatation. SPLEEN: No acute abnormality. PANCREAS: Pancreas is normal in size and contour without focal lesion or ductal dilatation. ADRENAL GLANDS: No acute abnormality. KIDNEYS, URETERS AND BLADDER: Multiple left renal calculi are identified. The largest is in the inferior pole measuring 6 mm, image 36/2. Exhibited lesion arising off the inferior pole of the right kidney containing areas of macroscopic fat are again noted compatible with a benign angiomyolipoma. This measures 2.6 x 1.8 cm and is unchanged from the previous study. Bosniak class 1 and 2 kidney cysts are noted bilaterally. The largest arises off the upper pole of the right kidney measuring 4.2 x 3.0 cm. Per consensus, no follow-up is needed for simple Bosniak type 1 and 2 renal cysts, unless the patient has a malignancy history or risk factors. Multiple bilateral calcified phleboliths are identified along the course of both ureters and in both sides of the pelvis. These appear similar in multiplicity and distribution compared with the previous exam. No hydronephrosis. No perinephric or periureteral stranding. Urinary bladder is unremarkable. GI AND BOWEL: Moderate-sized hiatal hernia. The appendix is not confidently identified; no secondary signs of acute appendicitis noted. Sigmoid colon demonstrates diverticulosis without evidence of diverticulitis. No bowel wall thickening,  pericolonic stranding, abscess, or free air. Mild to moderate stool burden noted within the colon. There is no bowel obstruction. PERITONEUM AND RETROPERITONEUM: No ascites. No free air. Small fat-containing umbilical hernia. VASCULATURE: Aorta is normal in caliber. LYMPH NODES: No lymphadenopathy. REPRODUCTIVE ORGANS: No acute abnormality. BONES AND SOFT TISSUES: Unchanged appearance of mild superior endplate compression fracture involving the L2 vertebra. No focal soft tissue abnormality. IMPRESSION: 1. No acute findings. 2. Multiple left renal calculi, largest measuring 6 mm. 3. 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 4. Moderate-sized hiatal hernia. Electronically signed by: Waddell Calk MD 06/20/2024 09:58 AM EST RP Workstation: [id number]     Procedures   Medications Ordered in the ED  sulfamethoxazole -trimethoprim  (BACTRIM  DS) 800-160 MG per tablet 1 tablet (has no administration in time range)  iohexol  (OMNIPAQUE ) 300 MG/ML solution 80 mL (80 mLs Intravenous Contrast Given 06/20/24 0918)                                    Medical Decision Making Amount and/or Complexity of Data Reviewed  Labs: ordered. Radiology: ordered.  Risk Prescription drug management.   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  Unremarkable vitals.  History of cholecystectomy abdominal hysterectomy.  History of atrial fibrillation IBS diverticulitis.  Differential includes these things including IBS flare diverticulitis less likely bowel obstruction UTI.  Does not have any chest pain or shortness of breath.  Well-appearing otherwise.  Will get CBC CMP lipase urinalysis CT scan abdomen pelvis and reevaluate.  Overall lab work per my review and interpretation is unremarkable.  No significant leukocytosis anemia or electrolyte abnormality.  CT scan of the abdomen pelvis shows no acute findings.  Does show some stool burden.  Recommend MiraLAX.  I do suspect pain secondary to IBS/constipation.   Urinalysis with possible urinary tract infection despite she is not having major symptoms.  Will treat with antibiotics and send urine culture.  Recommend continue management at home and follow-up primary care doctor.  Discharged in good condition.  Understands return precautions.  Pain-free on my reevaluation.  This chart was dictated using voice recognition software.  Despite best efforts to proofread,  errors can occur which can change the documentation meaning.      Final diagnoses:  Abdominal pain, unspecified abdominal location  Lower urinary tract infectious disease    ED Discharge Orders          Ordered    sulfamethoxazole -trimethoprim  (BACTRIM  DS) 800-160 MG tablet  2 times daily        06/20/24 1041               Ruthe Cornet, DO 06/20/24 1031    Ruthe Cornet, DO 06/20/24 1041

## 2024-06-20 NOTE — Discharge Instructions (Addendum)
 Lab work and imaging today are unremarkable.  Recommend increase hydration at home.  Use MiraLAX 1-2 scoops 20 ounces of water daily to make sure you are having nice easy bowel movements.  Take antibiotic for possible urinary tract infection. follow-up with your primary care doctor for further management.

## 2024-06-20 NOTE — ED Triage Notes (Signed)
 Mid abd pain radiates to BIL flank since this morning. Denies NVD, urinary symptoms

## 2024-06-20 NOTE — ED Notes (Signed)
 Called lab for urine culture add on.

## 2024-06-20 NOTE — ED Notes (Signed)
 Pt stated she does not feel sharp pain at this time and refused fentanyl at this time. MD notified. MD stated to keep medication order, use if pain comes back

## 2024-06-20 NOTE — ED Notes (Signed)

## 2024-06-21 NOTE — Telephone Encounter (Signed)
 11/13 lab results faxed to Dr. Lauraine Fleming via Epic fax.

## 2024-06-22 LAB — URINE CULTURE: Culture: >=100000 — AB

## 2024-06-23 ENCOUNTER — Telehealth (HOSPITAL_BASED_OUTPATIENT_CLINIC_OR_DEPARTMENT_OTHER): Payer: Self-pay | Admitting: Emergency Medicine

## 2024-06-23 NOTE — Telephone Encounter (Signed)
 Post ED Visit - Positive Culture Follow-up  Culture report reviewed by antimicrobial stewardship pharmacist: [facility] []  Rankin Dee, Pharm.D. []  Venetia Gully, Pharm.D., BCPS AQ-ID []  Garrel Crews, Pharm.D., BCPS []  Almarie Lunger, [address].D., BCPS []  [place], [place].D., BCPS, AAHIVP []  Rosaline Bihari, Pharm.D., BCPS, AAHIVP [x]  Vernell Meier, PharmD, BCPS []  Latanya Hint, PharmD, BCPS []  Donald Medley, PharmD, BCPS []  Rocky Bold, PharmD []  Dorothyann Alert, PharmD, BCPS []  Morene Babe, PharmD  [facility] []  Rosaline Edison, PharmD []  Romona Bliss, PharmD []  Dolphus Roller, PharmD []  Veva Seip, Rph []  Vernell Daunt) Leonce, PharmD []  Eva Allis, PharmD []  Rosaline Millet, PharmD []  Iantha Batch, PharmD []  Arvin Gauss, PharmD []  Wanda Hasting, PharmD []  Ronal Rav, PharmD []  Rocky Slade, PharmD []  Bard Jeans, PharmD   Positive urine culture Treated with Sulfamethoxazole -Trimethoprim , organism sensitive to the same and no further patient follow-up is required at this time.  Gwendolyn Alvarado 06/23/2024, 11:43 AM

## 2024-06-24 DIAGNOSIS — M1711 Unilateral primary osteoarthritis, right knee: Secondary | ICD-10-CM | POA: Diagnosis not present

## 2024-06-29 DIAGNOSIS — M1711 Unilateral primary osteoarthritis, right knee: Secondary | ICD-10-CM | POA: Diagnosis not present

## 2024-06-29 DIAGNOSIS — M6281 Muscle weakness (generalized): Secondary | ICD-10-CM | POA: Diagnosis not present

## 2024-06-29 DIAGNOSIS — M25661 Stiffness of right knee, not elsewhere classified: Secondary | ICD-10-CM | POA: Diagnosis not present

## 2024-07-05 DIAGNOSIS — N200 Calculus of kidney: Secondary | ICD-10-CM | POA: Diagnosis not present

## 2024-07-06 DIAGNOSIS — M25661 Stiffness of right knee, not elsewhere classified: Secondary | ICD-10-CM | POA: Diagnosis not present

## 2024-07-06 DIAGNOSIS — M1711 Unilateral primary osteoarthritis, right knee: Secondary | ICD-10-CM | POA: Diagnosis not present

## 2024-07-06 DIAGNOSIS — M6281 Muscle weakness (generalized): Secondary | ICD-10-CM | POA: Diagnosis not present

## 2024-07-08 DIAGNOSIS — D1771 Benign lipomatous neoplasm of kidney: Secondary | ICD-10-CM | POA: Diagnosis not present

## 2024-07-08 DIAGNOSIS — N200 Calculus of kidney: Secondary | ICD-10-CM | POA: Diagnosis not present

## 2024-07-13 MED ORDER — HYOSCYAMINE SULFATE 0.125 MG PO TABS: 0.1250 mg | ORAL_TABLET | Freq: Four times a day (QID) | ORAL | 0 refills | Status: DC | PRN

## 2024-07-13 NOTE — Addendum Note (Signed)
 Addended by: Alegria Dominique N on: 07/13/2024 02:36 PM   Modules accepted: Orders

## 2024-08-03 ENCOUNTER — Ambulatory Visit: Admitting: Physician Assistant

## 2024-08-03 MED ORDER — CIPROFLOXACIN HCL 500 MG PO TABS: 500.0000 mg | ORAL_TABLET | Freq: Two times a day (BID) | ORAL | 0 refills | Status: DC

## 2024-08-03 MED ORDER — CIPROFLOXACIN HCL 500 MG PO TABS: 500.0000 mg | ORAL_TABLET | Freq: Two times a day (BID) | ORAL | 1 refills | Status: AC

## 2024-08-03 NOTE — Telephone Encounter (Signed)
 Abran Norleen SAILOR, MD to Me  (Selected Message)     08/02/24  9:22 PM Yes, please provide the Ms. Lafauci with a prescription for Cipro  500 mg po bid x 7 days; #14; 1 refill. Thanks, Dr. Abran

## 2024-08-05 ENCOUNTER — Other Ambulatory Visit: Payer: Self-pay | Admitting: Internal Medicine

## 2024-08-05 DIAGNOSIS — I10 Essential (primary) hypertension: Secondary | ICD-10-CM

## 2024-08-06 ENCOUNTER — Telehealth: Payer: Self-pay | Admitting: Physician Assistant

## 2024-08-06 NOTE — Telephone Encounter (Signed)
 PT would like to speak with nurse regarding the medications she has been prescribed. She stated that they are not working and she needs to have everything right for her trip coming soon. Please advise.

## 2024-08-10 MED ORDER — HYOSCYAMINE SULFATE 0.125 MG PO TABS: 0.1250 mg | ORAL_TABLET | Freq: Four times a day (QID) | ORAL | 1 refills | Status: AC | PRN

## 2024-08-10 NOTE — Telephone Encounter (Signed)
 Please refer to my chart message.

## 2024-09-10 ENCOUNTER — Other Ambulatory Visit: Payer: Self-pay | Admitting: Internal Medicine

## 2024-09-28 ENCOUNTER — Ambulatory Visit: Admitting: Cardiology
